# Patient Record
Sex: Female | Born: 2000 | Race: Black or African American | Hispanic: No | Marital: Single | Smoking: Never smoker
Health system: Southern US, Community
[De-identification: ages and names within clinical notes are randomized; demographics above are authoritative.]

## PROBLEM LIST (undated history)

## (undated) DIAGNOSIS — Z789 Other specified health status: Secondary | ICD-10-CM

## (undated) HISTORY — PX: NO PAST SURGERIES: SHX2092

---
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
## 2018-04-21 ENCOUNTER — Encounter (HOSPITAL_COMMUNITY): Payer: Self-pay | Admitting: Emergency Medicine

## 2018-04-21 ENCOUNTER — Other Ambulatory Visit: Payer: Self-pay

## 2018-04-21 ENCOUNTER — Emergency Department (HOSPITAL_COMMUNITY)
Admission: EM | Admit: 2018-04-21 | Discharge: 2018-04-21 | Disposition: A | Discharge status: Home / Self Care | Payer: Medicaid Other | Attending: Emergency Medicine | Admitting: Emergency Medicine

## 2018-04-21 DIAGNOSIS — Z7722 Contact with and (suspected) exposure to environmental tobacco smoke (acute) (chronic): Secondary | ICD-10-CM | POA: Insufficient documentation

## 2018-04-21 DIAGNOSIS — L0291 Cutaneous abscess, unspecified: Secondary | ICD-10-CM

## 2018-04-21 DIAGNOSIS — L02416 Cutaneous abscess of left lower limb: Secondary | ICD-10-CM | POA: Insufficient documentation

## 2018-04-21 MED ORDER — LIDOCAINE HCL (PF) 1 % IJ SOLN
10.0000 mL | Freq: Once | INTRAMUSCULAR | Status: DC
  Filled 2018-04-21: qty 10

## 2018-04-21 MED ORDER — POVIDONE-IODINE 10 % EX SOLN
CUTANEOUS | Status: AC
  Filled 2018-04-21: qty 15

## 2018-04-21 MED ORDER — SULFAMETHOXAZOLE-TRIMETHOPRIM 800-160 MG PO TABS: 1.0000 | ORAL_TABLET | Freq: Two times a day (BID) | ORAL | 0 refills | Status: AC

## 2018-04-21 MED ORDER — LIDOCAINE HCL (PF) 1 % IJ SOLN
INTRAMUSCULAR | Status: AC
  Filled 2018-04-21: qty 5

## 2018-04-21 NOTE — ED Triage Notes (Signed)
Patient with abscess to her upper inner left thigh, onset 1 week ago.

## 2018-04-21 NOTE — Discharge Instructions (Signed)
You need to soak area 20 minutes 4 times a day.  Return if fever or chills.

## 2018-04-21 NOTE — ED Provider Notes (Signed)
[facility] EMERGENCY DEPARTMENT Provider Note   CSN: [id number] Arrival date & time: 04/21/18  1802     History   Chief Complaint Chief Complaint  Patient presents with  . Abscess    HPI Cindy Randall is a 17 y.o. female.  The history is provided by the patient. No language interpreter was used.  Abscess  Location:  Leg Leg abscess location:  L upper leg Size:  2  Abscess quality: painful, redness and warmth   Red streaking: no   Progression:  Worsening Pain details:    Quality:  No pain   Severity:  No pain   Timing:  Constant   Progression:  Worsening Relieved by:  Nothing Worsened by:  Nothing Ineffective treatments:  None tried   History reviewed. No pertinent past medical history.  Patient Active Problem List   Diagnosis Date Noted  . Contraception management 01/21/2013    History reviewed. No pertinent surgical history.   OB History   None      Home Medications    Prior to Admission medications   Medication Sig Start Date End Date Taking? Authorizing Provider  ibuprofen (ADVIL,MOTRIN) 200 MG tablet Take 200 mg by mouth every 6 (six) hours as needed.    [provider]  ondansetron (ZOFRAN ODT) 4 MG disintegrating tablet Take 1 tablet (4 mg total) by mouth every 8 (eight) hours as needed for nausea or vomiting. 10/16/17   Samuel JesterMcManus, Kathleen, DO  sulfamethoxazole-trimethoprim (BACTRIM DS,SEPTRA DS) 800-160 MG tablet Take 1 tablet by mouth 2 (two) times daily for 7 days. 04/21/18 04/28/18  Elson AreasSofia, Leslie K, PA-C    Family History History reviewed. No pertinent family history.  Social History Social History   Tobacco Use  . Smoking status: Passive Smoke Exposure - Never Smoker  . Smokeless tobacco: Never Used  Substance Use Topics  . Alcohol use: No  . Drug use: Yes    Types: Marijuana    Comment: yesterday     Allergies   Patient has no known allergies.   Review of Systems Review of Systems  All other systems reviewed  and are negative.    Physical Exam Updated Vital Signs BP 120/70   Pulse (!) 106   Temp 98.6 F (37 C) (Oral)   Resp 12   Ht 5' (1.524 m)   Wt 65.8 kg   LMP 04/17/2018   SpO2 96%   BMI 28.32 kg/m   Physical Exam  Constitutional: She appears well-developed and well-nourished.  Musculoskeletal: Normal range of motion.  2cm area of swelling left inner thigh.   Neurological: She is alert.  Skin: Skin is warm.  Nursing note and vitals reviewed.    ED Treatments / Results  Labs (all labs ordered are listed, but only abnormal results are displayed) Labs Reviewed - No data to display  EKG None  Radiology No results found.  Procedures Procedures (including critical care time)  Medications Ordered in ED Medications  lidocaine (PF) (XYLOCAINE) 1 % injection 10 mL (has no administration in time range)  lidocaine (PF) (XYLOCAINE) 1 % injection (has no administration in time range)  povidone-iodine (BETADINE) 10 % external solution (has no administration in time range)     Initial Impression / Assessment and Plan / ED Course  I have reviewed the triage vital signs and the nursing notes.  Pertinent labs & imaging results that were available during my care of the patient were reviewed by me and considered in my medical decision making (  see chart for details).     I attempted I and D.  Pt unable to tolerate. (Pt laughing and jerking.  I am unable to numb area. Pt kicking leg)  Pt states she can not do.  I will try treating with antibiotic and warm compresses   Final Clinical Impressions(s) / ED Diagnoses   Final diagnoses:  Abscess    ED Discharge Orders         Ordered    sulfamethoxazole-trimethoprim (BACTRIM DS,SEPTRA DS) 800-160 MG tablet  2 times daily     04/21/18 1916        An After Visit Summary was printed and given to the patient.   Elson Areas, Cordelia Poche 04/21/18 2028    Vanetta Mulders, MD 04/22/18 [phone]

## 2020-08-28 ENCOUNTER — Ambulatory Visit
Admission: EM | Admit: 2020-08-28 | Discharge: 2020-08-28 | Disposition: A | Discharge status: Home / Self Care | Payer: Self-pay

## 2020-08-28 ENCOUNTER — Encounter: Payer: Self-pay | Admitting: Emergency Medicine

## 2020-08-28 ENCOUNTER — Other Ambulatory Visit: Payer: Self-pay

## 2020-08-28 DIAGNOSIS — N764 Abscess of vulva: Secondary | ICD-10-CM

## 2020-08-28 MED ORDER — DOXYCYCLINE HYCLATE 100 MG PO CAPS: 100.0000 mg | ORAL_CAPSULE | Freq: Two times a day (BID) | ORAL | 0 refills | Status: DC

## 2020-08-28 NOTE — ED Triage Notes (Signed)
States she has an abscess on her labia.  History of abscesses on the right side.  This one is on the left.

## 2020-08-28 NOTE — ED Provider Notes (Signed)
[facility] CARE CENTER   [id number] 08/28/20 Arrival Time: 1442   WU:[id number]  SUBJECTIVE:  Cindy Randall is a 20 y.o. female who presents with a possible abscess of her left labia x few days. Denies a precipitating event or trauma.  But does admit to shaving.  Has tried epsom salt baths with relief.  Sore to the touch.  Reports similar symptoms and had incision and drainage.  Denies fever, chills, nausea, vomiting. Complains of redness, and swelling.     ROS: As per HPI.  All other pertinent ROS negative.     History reviewed. No pertinent past medical history. History reviewed. No pertinent surgical history. No Known Allergies No current facility-administered medications on file prior to encounter.   Current Outpatient Medications on File Prior to Encounter  Medication Sig Dispense Refill  . clindamycin (CLEOCIN) 300 MG capsule Take 300 mg by mouth 3 (three) times daily.    Marland Kitchen ibuprofen (ADVIL,MOTRIN) 200 MG tablet Take 200 mg by mouth every 6 (six) hours as needed.     Social History   Socioeconomic History  . Marital status: Single    Spouse name: Not on file  . Number of children: Not on file  . Years of education: Not on file  . Highest education level: Not on file  Occupational History  . Not on file  Tobacco Use  . Smoking status: Passive Smoke Exposure - Never Smoker  . Smokeless tobacco: Never Used  Vaping Use  . Vaping Use: Never used  Substance and Sexual Activity  . Alcohol use: No  . Drug use: Yes    Types: Marijuana    Comment: yesterday  . Sexual activity: Not Currently    Birth control/protection: Implant  Other Topics Concern  . Not on file  Social History Narrative  . Not on file   Social Determinants of Health   Financial Resource Strain: Not on file  Food Insecurity: Not on file  Transportation Needs: Not on file  Physical Activity: Not on file  Stress: Not on file  Social Connections: Not on file  Intimate Partner Violence: Not on  file   History reviewed. No pertinent family history.  OBJECTIVE:  Vitals:   08/28/20 1453  BP: 122/77  Pulse: 92  Resp: 18  Temp: 99 F (37.2 C)  TempSrc: Oral  SpO2: 98%     General appearance: alert; no distress Skin: 2-3 cm induration of her LT labia; tender to touch; no active drainage Psychological: alert and cooperative; normal mood and affect  ASSESSMENT & PLAN:  1. Labial abscess     Meds ordered this encounter  Medications  . doxycycline (VIBRAMYCIN) 100 MG capsule    Sig: Take 1 capsule (100 mg total) by mouth 2 (two) times daily.    Dispense:  20 capsule    Refill:  0    Order Specific Question:   Supervising Provider    Answer:   Eustace Moore [[id number]]    Apply warm compresses 3-4x daily for 10-15 minutes Wash site daily with warm water and mild soap Keep covered to avoid friction Take antibiotic as prescribed and to completion Follow up here or with PCP if symptoms persists Return or go to the ED if you have any new or worsening symptoms increased redness, swelling, pain, nausea, vomiting, fever, chills, etc...   Reviewed expectations re: course of current medical issues. Questions answered. Outlined signs and symptoms indicating need for more acute intervention. Patient verbalized understanding. After Visit Summary given.  Rennis Harding, PA-C 08/28/20 1514

## 2020-08-28 NOTE — Discharge Instructions (Signed)
Apply warm compresses 3-4x daily for 10-15 minutes Wash site daily with warm water and mild soap Keep covered to avoid friction Take antibiotic as prescribed and to completion Follow up here or with PCP if symptoms persists Return or go to the ED if you have any new or worsening symptoms increased redness, swelling, pain, nausea, vomiting, fever, chills, etc...  

## 2021-01-29 ENCOUNTER — Emergency Department (HOSPITAL_COMMUNITY): Payer: No Typology Code available for payment source

## 2021-01-29 ENCOUNTER — Other Ambulatory Visit: Payer: Self-pay

## 2021-01-29 ENCOUNTER — Encounter (HOSPITAL_COMMUNITY): Payer: Self-pay

## 2021-01-29 ENCOUNTER — Emergency Department (HOSPITAL_COMMUNITY)
Admission: EM | Admit: 2021-01-29 | Discharge: 2021-01-29 | Disposition: A | Discharge status: Home / Self Care | Payer: No Typology Code available for payment source | Attending: Emergency Medicine | Admitting: Emergency Medicine

## 2021-01-29 DIAGNOSIS — Y900 Blood alcohol level of less than 20 mg/100 ml: Secondary | ICD-10-CM | POA: Insufficient documentation

## 2021-01-29 DIAGNOSIS — S0081XA Abrasion of other part of head, initial encounter: Secondary | ICD-10-CM | POA: Diagnosis not present

## 2021-01-29 DIAGNOSIS — S99912A Unspecified injury of left ankle, initial encounter: Secondary | ICD-10-CM | POA: Diagnosis present

## 2021-01-29 DIAGNOSIS — S82842A Displaced bimalleolar fracture of left lower leg, initial encounter for closed fracture: Secondary | ICD-10-CM | POA: Insufficient documentation

## 2021-01-29 DIAGNOSIS — J3489 Other specified disorders of nose and nasal sinuses: Secondary | ICD-10-CM | POA: Insufficient documentation

## 2021-01-29 DIAGNOSIS — Z20822 Contact with and (suspected) exposure to covid-19: Secondary | ICD-10-CM | POA: Diagnosis not present

## 2021-01-29 DIAGNOSIS — T1490XA Injury, unspecified, initial encounter: Secondary | ICD-10-CM

## 2021-01-29 DIAGNOSIS — Z79899 Other long term (current) drug therapy: Secondary | ICD-10-CM | POA: Diagnosis not present

## 2021-01-29 DIAGNOSIS — Z7722 Contact with and (suspected) exposure to environmental tobacco smoke (acute) (chronic): Secondary | ICD-10-CM | POA: Diagnosis not present

## 2021-01-29 DIAGNOSIS — E872 Acidosis: Secondary | ICD-10-CM | POA: Diagnosis not present

## 2021-01-29 DIAGNOSIS — R000 Tachycardia, unspecified: Secondary | ICD-10-CM | POA: Diagnosis not present

## 2021-01-29 DIAGNOSIS — E876 Hypokalemia: Secondary | ICD-10-CM | POA: Diagnosis not present

## 2021-01-29 DIAGNOSIS — Y92410 Unspecified street and highway as the place of occurrence of the external cause: Secondary | ICD-10-CM | POA: Diagnosis not present

## 2021-01-29 LAB — HCG, QUANTITATIVE, PREGNANCY: hCG, Beta Chain, Quant, S: <1 m[IU]/mL (ref <5)

## 2021-01-29 LAB — COMPREHENSIVE METABOLIC PANEL
ALT: 31 U/L (ref 0–44)
AST: 61 U/L — ABNORMAL HIGH (ref 15–41)
Albumin: 3.7 g/dL (ref 3.5–5.0)
Alkaline Phosphatase: 38 U/L (ref 38–126)
Anion gap: 7 (ref 5–15)
BUN: 10 mg/dL (ref 6–20)
CO2: 22 mmol/L (ref 22–32)
Calcium: 8.4 mg/dL — ABNORMAL LOW (ref 8.9–10.3)
Chloride: 109 mmol/L (ref 98–111)
Creatinine, Ser: 0.91 mg/dL (ref 0.44–1.00)
GFR, Estimated: >60 mL/min (ref >60)
Glucose, Bld: 133 mg/dL — ABNORMAL HIGH (ref 70–99)
Potassium: 3.2 mmol/L — ABNORMAL LOW (ref 3.5–5.1)
Sodium: 138 mmol/L (ref 135–145)
Total Bilirubin: 0.4 mg/dL (ref 0.3–1.2)
Total Protein: 6.5 g/dL (ref 6.5–8.1)

## 2021-01-29 LAB — RESP PANEL BY RT-PCR (FLU A&B, COVID) ARPGX2
Influenza A by PCR: NEGATIVE
Influenza B by PCR: NEGATIVE
SARS Coronavirus 2 by RT PCR: NEGATIVE

## 2021-01-29 LAB — CBC WITH DIFFERENTIAL/PLATELET
Abs Immature Granulocytes: 0.04 10*3/uL (ref 0.00–0.07)
Basophils Absolute: 0.1 10*3/uL (ref 0.0–0.1)
Basophils Relative: 0 %
Eosinophils Absolute: 0.1 10*3/uL (ref 0.0–0.5)
Eosinophils Relative: 1 %
HCT: 37.5 % (ref 36.0–46.0)
Hemoglobin: 12.6 g/dL (ref 12.0–15.0)
Immature Granulocytes: 0 %
Lymphocytes Relative: 18 %
Lymphs Abs: 2 10*3/uL (ref 0.7–4.0)
MCH: 31.3 pg (ref 26.0–34.0)
MCHC: 33.6 g/dL (ref 30.0–36.0)
MCV: 93.3 fL (ref 80.0–100.0)
Monocytes Absolute: 0.7 10*3/uL (ref 0.1–1.0)
Monocytes Relative: 7 %
Neutro Abs: 8.5 10*3/uL — ABNORMAL HIGH (ref 1.7–7.7)
Neutrophils Relative %: 74 %
Platelets: 181 10*3/uL (ref 150–400)
RBC: 4.02 MIL/uL (ref 3.87–5.11)
RDW: 13.4 % (ref 11.5–15.5)
WBC: 11.4 10*3/uL — ABNORMAL HIGH (ref 4.0–10.5)
nRBC: 0 % (ref 0.0–0.2)

## 2021-01-29 LAB — ETHANOL: Alcohol, Ethyl (B): <10 mg/dL (ref <10)

## 2021-01-29 LAB — PROTIME-INR
INR: 1.1 (ref 0.8–1.2)
Prothrombin Time: 13.8 seconds (ref 11.4–15.2)

## 2021-01-29 LAB — LACTIC ACID, PLASMA: Lactic Acid, Venous: 2.2 mmol/L (ref 0.5–1.9)

## 2021-01-29 MED ORDER — FENTANYL CITRATE PF 50 MCG/ML IJ SOSY
100.0000 ug | PREFILLED_SYRINGE | Freq: Once | INTRAMUSCULAR | Status: AC
  Administered 2021-01-29: 50 ug via INTRAVENOUS
  Filled 2021-01-29: qty 2

## 2021-01-29 MED ORDER — LACTATED RINGERS IV BOLUS
1000.0000 mL | Freq: Once | INTRAVENOUS | Status: AC
  Administered 2021-01-29: 1000 mL via INTRAVENOUS

## 2021-01-29 MED ORDER — OXYCODONE-ACETAMINOPHEN 5-325 MG PO TABS: 1.0000 | ORAL_TABLET | Freq: Four times a day (QID) | ORAL | 0 refills | Status: DC | PRN

## 2021-01-29 MED ORDER — ONDANSETRON HCL 4 MG/2ML IJ SOLN
INTRAMUSCULAR | Status: AC
  Administered 2021-01-29: 4 mg via INTRAVENOUS
  Filled 2021-01-29: qty 2

## 2021-01-29 MED ORDER — ONDANSETRON HCL 4 MG/2ML IJ SOLN
4.0000 mg | Freq: Once | INTRAMUSCULAR | Status: AC
Start: 2021-01-29 — End: 2021-01-29
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
## 2021-01-29 NOTE — ED Notes (Signed)
Pt verbalized understanding of no driving and to use caution within 4 hours of taking pain meds due to meds cause drowsiness.  Pt made aware that pain medication causes constipation as well.
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
## 2021-01-29 NOTE — ED Notes (Signed)
Patient transported to CT 

## 2021-01-29 NOTE — ED Notes (Signed)
Date and time results received: 01/29/21 1210   Test: lac Critical Value: 2.2  Name of Provider Notified: Lupe Carney

## 2021-01-30 ENCOUNTER — Ambulatory Visit (INDEPENDENT_AMBULATORY_CARE_PROVIDER_SITE_OTHER): Payer: Self-pay | Admitting: Orthopedic Surgery

## 2021-01-30 ENCOUNTER — Encounter: Payer: Self-pay | Admitting: Orthopedic Surgery

## 2021-01-30 VITALS — BP 113/72 | HR 80 | Ht 62.0 in | Wt 124.0 lb

## 2021-01-30 DIAGNOSIS — S82892A Other fracture of left lower leg, initial encounter for closed fracture: Secondary | ICD-10-CM
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
## 2021-01-30 NOTE — Addendum Note (Signed)
Addended by: Thane Edu A on: 01/30/2021 10:39 PM   Modules accepted: Orders, SmartSet

## 2021-01-31 NOTE — Patient Instructions (Signed)
Cindy Randall  01/31/2021     @PREFPERIOPPHARMACY @   Your procedure is scheduled on  02/07/2021.   Report to 04/09/2021 at  [phone]  A.M.   Call this number if you have problems the morning of surgery:  [phone]   Remember:  Do not eat or drink after midnight.      Take these medicines the morning of surgery with A SIP OF WATER                                oxycodone (if needed).      Do not wear jewelry, make-up or nail polish.  Do not wear lotions, powders, or perfumes, or deodorant.  Do not shave 48 hours prior to surgery.  Men may shave face and neck.  Do not bring valuables to the hospital.  [facility] is not responsible for any belongings or valuables.  Contacts, dentures or bridgework may not be worn into surgery.  Leave your suitcase in the car.  After surgery it may be brought to your room.  For patients admitted to the hospital, discharge time will be determined by your treatment team.  Patients discharged the day of surgery will not be allowed to drive home and must have someone with them for 24 hours.    Special instructions:    DO NOT smoke tobacco or vape for 24 hours before your procedure.  Please read over the following fact sheets that you were given. Coughing and Deep Breathing, Surgical Site Infection Prevention, Anesthesia Post-op Instructions, and Care and Recovery After Surgery      Displaced Bimalleolar Ankle Fracture Treated With ORIF, Care After This sheet gives you information about how to care for yourself after your procedure. Your health care provider may also give you more specific instructions. If you have problems or questions, contact your health care provider. What can I expect after the procedure? After the procedure, it is common to have: Pain. Swelling. A small amount of fluid from your incision. Follow these instructions at home: Medicines Take over-the-counter and prescription medicines only as told by your  health care provider. Ask your health care provider if the medicine prescribed to you: Requires you to avoid driving or using machinery. Can cause constipation. You may need to take these actions to prevent or treat constipation: Drink enough fluid to keep your urine pale yellow. Take over-the-counter or prescription medicines. Eat foods that are high in fiber, such as beans, whole grains, and fresh fruits and vegetables. Limit foods that are high in fat and processed sugars, such as fried or sweet foods. If you have a splint or boot: Wear the splint or boot as told by your health care provider. Remove it only as told by your health care provider. Loosen the splint or boot if your toes tingle, become numb, or turn cold and blue. Keep the splint or boot clean. If you have a cast: Do not stick anything inside the cast to scratch your skin. Doing that increases your risk of infection. Check the skin around the cast every day. Tell your health care provider about any concerns. You may put lotion on dry skin around the edges of the cast. Do not put lotion on the skin underneath the cast. Keep the cast clean. Bathing Do not take baths, swim, or use a hot tub until your health care provider approves. Ask your  health care provider if you may take showers. You may only be allowed to take sponge baths. If your splint, boot, or cast is not waterproof: Do not let it get wet. Cover it with a watertight covering when you take a bath or a shower. Keep the bandage (dressing) dry until your health care provider says it can be removed. Incision care  Follow instructions from your health care provider about how to take care of your incision. Make sure you: Wash your hands with soap and water for at least 20 seconds before and after you change your dressing. If soap and water are not available, use hand sanitizer. Change your dressing as told by your health care provider. Leave stitches (sutures), skin glue,  or adhesive strips in place. These skin closures may need to stay in place for 2 weeks or longer. If adhesive strip edges start to loosen and curl up, you may trim the loose edges. Do not remove adhesive strips completely unless your health care provider tells you to do that. Check your incision area every day for signs of infection. Check for: Redness. More pain or more swelling. Blood or more fluid. Warmth. Pus or a bad smell. Managing pain, stiffness, and swelling  If directed, put ice on the affected area. To do this: If you have a removable splint or boot, remove it as told by your health care provider. Put ice in a plastic bag. Place a towel between your skin and the bag, or between your cast and the bag. Leave the ice on for 20 minutes, 2-3 times a day. Remove the ice if your skin turns bright red. This is very important. If you cannot feel pain, heat, or cold, you have a greater risk of damage to the area. Move your toes often to reduce stiffness and swelling. Raise (elevate) the injured area above the level of your heart while you are sitting or lying down. To do this, try putting a few pillows under your leg and ankle. Activity Do not use your injured limb to support (bear) your body weight until your health care provider says that you can. Follow weight-bearing restrictions as told. Use crutches or other assistive devicesto help you move around as told by your health care provider. Ask your health care provider when it is safe to drive if you have a splint, boot, or cast on your foot. Do exercises as told by your health care provider or physical therapist. Return to your normal activities as told by your health care provider. Ask your health care provider what activities are safe for you. General instructions Do not put pressure on any part of the splint or cast until it is fully hardened, if applicable. This may take several hours. Do not use any products that contain nicotine or  tobacco, such as cigarettes, e-cigarettes, and chewing tobacco. These can delay bone healing. If you need help quitting, ask your health care provider. Keep all follow-up visits. This is important. Contact a health care provider if: You have a fever. Your pain medicine is not helping. You have redness around your incision. You have more swelling or severe pain around your incision. You have more fluid or blood coming from your incision or leaking through your cast. Your incision feels warm to the touch. You have pus or a bad smell coming from your incision or from your cast or dressing. Get help right away if: The edges of your incision come apart after the stitches or staples have  been taken out. You have chest pain. You have trouble breathing. Your foot or leg feels numb or tingles. Your foot becomes cold, pale, or blue. You have calf swelling or tenderness. These symptoms may represent a serious problem that is an emergency. Do not wait to see if the symptoms will go away. Get medical help right away. Call your local emergency services (911 in the [place] Do not drive yourself to the hospital. Summary After the procedure, it is common to have pain and swelling. If your splint, boot, or cast is not waterproof, do not let it get wet. Contact your health care provider if you have more swelling or severe pain, or if you have more fluids coming from your incision or leaking through your cast. Get help right away if you have numbness or tingling in your foot or leg, or if your foot becomes cold, pale, or blue. This information is not intended to replace advice given to you by your health care provider. Make sure you discuss any questions you have with your health care provider. Document Revised: 08/22/2019 Document Reviewed: 08/22/2019 Elsevier Patient Education  2022 [employer]. General Anesthesia, Adult, Care After This sheet gives you information about how to care for yourself after your  procedure. Your health care provider may also give you more specific instructions. If you have problems or questions, contact your health care provider. What can I expect after the procedure? After the procedure, the following side effects are common: Pain or discomfort at the IV site. Nausea. Vomiting. Sore throat. Trouble concentrating. Feeling cold or chills. Feeling weak or tired. Sleepiness and fatigue. Soreness and body aches. These side effects can affect parts of the body that were not involved in surgery. Follow these instructions at home: For the time period you were told by your health care provider:  Rest. Do not participate in activities where you could fall or become injured. Do not drive or use machinery. Do not drink alcohol. Do not take sleeping pills or medicines that cause drowsiness. Do not make important decisions or sign legal documents. Do not take care of children on your own. Eating and drinking Follow any instructions from your health care provider about eating or drinking restrictions. When you feel hungry, start by eating small amounts of foods that are soft and easy to digest (bland), such as toast. Gradually return to your regular diet. Drink enough fluid to keep your urine pale yellow. If you vomit, rehydrate by drinking water, juice, or clear broth. General instructions If you have sleep apnea, surgery and certain medicines can increase your risk for breathing problems. Follow instructions from your health care provider about wearing your sleep device: Anytime you are sleeping, including during daytime naps. While taking prescription pain medicines, sleeping medicines, or medicines that make you drowsy. Have a responsible adult stay with you for the time you are told. It is important to have someone help care for you until you are awake and alert. Return to your normal activities as told by your health care provider. Ask your health care provider what  activities are safe for you. Take over-the-counter and prescription medicines only as told by your health care provider. If you smoke, do not smoke without supervision. Keep all follow-up visits as told by your health care provider. This is important. Contact a health care provider if: You have nausea or vomiting that does not get better with medicine. You cannot eat or drink without vomiting. You have pain that does not  get better with medicine. You are unable to pass urine. You develop a skin rash. You have a fever. You have redness around your IV site that gets worse. Get help right away if: You have difficulty breathing. You have chest pain. You have blood in your urine or stool, or you vomit blood. Summary After the procedure, it is common to have a sore throat or nausea. It is also common to feel tired. Have a responsible adult stay with you for the time you are told. It is important to have someone help care for you until you are awake and alert. When you feel hungry, start by eating small amounts of foods that are soft and easy to digest (bland), such as toast. Gradually return to your regular diet. Drink enough fluid to keep your urine pale yellow. Return to your normal activities as told by your health care provider. Ask your health care provider what activities are safe for you. This information is not intended to replace advice given to you by your health care provider. Make sure you discuss any questions you have with your health care provider. Document Revised: 02/02/2020 Document Reviewed: 09/01/2019 Elsevier Patient Education  2022 Else[employer] Inc. How to Use Chlorhexidine for Bathing Chlorhexidine gluconate (CHG) is a germ-killing (antiseptic) solution that is used to clean the skin. It can get rid of the bacteria that normally live on the skin and can keep them away for about 24 hours. To clean your skin with CHG, you may be given: A CHG solution to use in the shower or as  part of a sponge bath. A prepackaged cloth that contains CHG. Cleaning your skin with CHG may help lower the risk for infection: While you are staying in the intensive care unit of the hospital. If you have a vascular access, such as a central line, to provide short-term or long-term access to your veins. If you have a catheter to drain urine from your bladder. If you are on a ventilator. A ventilator is a machine that helps you breathe by moving air in and out of your lungs. After surgery. What are the risks? Risks of using CHG include: A skin reaction. Hearing loss, if CHG gets in your ears and you have a perforated eardrum. Eye injury, if CHG gets in your eyes and is not rinsed out. The CHG product catching fire. Make sure that you avoid smoking and flames after applying CHG to your skin. Do not use CHG: If you have a chlorhexidine allergy or have previously reacted to chlorhexidine. On babies younger than 49 months of age. How to use CHG solution Use CHG only as told by your health care provider, and follow the instructions on the label. Use the full amount of CHG as directed. Usually, this is one bottle. During a shower Follow these steps when using CHG solution during a shower (unless your health care provider gives you different instructions): Start the shower. Use your normal soap and shampoo to wash your face and hair. Turn off the shower or move out of the shower stream. Pour the CHG onto a clean washcloth. Do not use any type of brush or rough-edged sponge. Starting at your neck, lather your body down to your toes. Make sure you follow these instructions: If you will be having surgery, pay special attention to the part of your body where you will be having surgery. Scrub this area for at least 1 minute. Do not use CHG on your head or face. If  the solution gets into your ears or eyes, rinse them well with water. Avoid your genital area. Avoid any areas of skin that have broken  skin, cuts, or scrapes. Scrub your back and under your arms. Make sure to wash skin folds. Let the lather sit on your skin for 1-2 minutes or as long as told by your health care provider. Thoroughly rinse your entire body in the shower. Make sure that all body creases and crevices are rinsed well. Dry off with a clean towel. Do not put any substances on your body afterward--such as powder, lotion, or perfume--unless you are told to do so by your health care provider. Only use lotions that are recommended by the manufacturer. Put on clean clothes or pajamas. If it is the night before your surgery, sleep in clean sheets.  During a sponge bath Follow these steps when using CHG solution during a sponge bath (unless your health care provider gives you different instructions): Use your normal soap and shampoo to wash your face and hair. Pour the CHG onto a clean washcloth. Starting at your neck, lather your body down to your toes. Make sure you follow these instructions: If you will be having surgery, pay special attention to the part of your body where you will be having surgery. Scrub this area for at least 1 minute. Do not use CHG on your head or face. If the solution gets into your ears or eyes, rinse them well with water. Avoid your genital area. Avoid any areas of skin that have broken skin, cuts, or scrapes. Scrub your back and under your arms. Make sure to wash skin folds. Let the lather sit on your skin for 1-2 minutes or as long as told by your health care provider. Using a different clean, wet washcloth, thoroughly rinse your entire body. Make sure that all body creases and crevices are rinsed well. Dry off with a clean towel. Do not put any substances on your body afterward--such as powder, lotion, or perfume--unless you are told to do so by your health care provider. Only use lotions that are recommended by the manufacturer. Put on clean clothes or pajamas. If it is the night before your  surgery, sleep in clean sheets. How to use CHG prepackaged cloths Only use CHG cloths as told by your health care provider, and follow the instructions on the label. Use the CHG cloth on clean, dry skin. Do not use the CHG cloth on your head or face unless your health care provider tells you to. When washing with the CHG cloth: Avoid your genital area. Avoid any areas of skin that have broken skin, cuts, or scrapes. Before surgery Follow these steps when using a CHG cloth to clean before surgery (unless your health care provider gives you different instructions): Using the CHG cloth, vigorously scrub the part of your body where you will be having surgery. Scrub using a back-and-forth motion for 3 minutes. The area on your body should be completely wet with CHG when you are done scrubbing. Do not rinse. Discard the cloth and let the area air-dry. Do not put any substances on the area afterward, such as powder, lotion, or perfume. Put on clean clothes or pajamas. If it is the night before your surgery, sleep in clean sheets.  For general bathing Follow these steps when using CHG cloths for general bathing (unless your health care provider gives you different instructions). Use a separate CHG cloth for each area of your body. Make sure  you wash between any folds of skin and between your fingers and toes. Wash your body in the following order, switching to a new cloth after each step: The front of your neck, shoulders, and chest. Both of your arms, under your arms, and your hands. Your stomach and groin area, avoiding the genitals. Your right leg and foot. Your left leg and foot. The back of your neck, your back, and your buttocks. Do not rinse. Discard the cloth and let the area air-dry. Do not put any substances on your body afterward--such as powder, lotion, or perfume--unless you are told to do so by your health care provider. Only use lotions that are recommended by the manufacturer. Put on  clean clothes or pajamas. Contact a health care provider if: Your skin gets irritated after scrubbing. You have questions about using your solution or cloth. You swallow any chlorhexidine. Call your local poison control center ([phone] in the [place] Get help right away if: Your eyes itch badly, or they become very red or swollen. Your skin itches badly and is red or swollen. Your hearing changes. You have trouble seeing. You have swelling or tingling in your mouth or throat. You have trouble breathing. These symptoms may represent a serious problem that is an emergency. Do not wait to see if the symptoms will go away. Get medical help right away. Call your local emergency services (911 in the [place] Do not drive yourself to the hospital. Summary Chlorhexidine gluconate (CHG) is a germ-killing (antiseptic) solution that is used to clean the skin. Cleaning your skin with CHG may help to lower your risk for infection. You may be given CHG to use for bathing. It may be in a bottle or in a prepackaged cloth to use on your skin. Carefully follow your health care provider's instructions and the instructions on the product label. Do not use CHG if you have a chlorhexidine allergy. Contact your health care provider if your skin gets irritated after scrubbing. This information is not intended to replace advice given to you by your health care provider. Make sure you discuss any questions you have with your health care provider. Document Revised: 07/30/2020 Document Reviewed: 07/30/2020 Elsevier Patient Education  2022 [employer].

## 2021-02-03 ENCOUNTER — Emergency Department (HOSPITAL_COMMUNITY): Payer: No Typology Code available for payment source

## 2021-02-03 ENCOUNTER — Other Ambulatory Visit: Payer: Self-pay

## 2021-02-03 ENCOUNTER — Encounter (HOSPITAL_COMMUNITY): Payer: Self-pay

## 2021-02-03 ENCOUNTER — Emergency Department (HOSPITAL_COMMUNITY)
Admission: EM | Admit: 2021-02-03 | Discharge: 2021-02-03 | Disposition: A | Discharge status: Home / Self Care | Payer: No Typology Code available for payment source | Attending: Emergency Medicine | Admitting: Emergency Medicine

## 2021-02-03 DIAGNOSIS — M25572 Pain in left ankle and joints of left foot: Secondary | ICD-10-CM | POA: Diagnosis present

## 2021-02-03 DIAGNOSIS — Y92410 Unspecified street and highway as the place of occurrence of the external cause: Secondary | ICD-10-CM | POA: Diagnosis not present

## 2021-02-03 NOTE — Discharge Instructions (Addendum)
Please follow up with your orthopedic doctor next week.   Continue to keep your leg elevated when at rest   Please return to the emergency department for any new or worsening symptoms.
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
## 2021-02-03 NOTE — ED Triage Notes (Signed)
Pt. States that they believe their left foot needs to be re-splinted. Per pt. Their foot has moved further out and their left leg hurts. Per Pt. They did see the orthopedic doctor last week.

## 2021-02-05 ENCOUNTER — Other Ambulatory Visit: Payer: Self-pay

## 2021-02-05 ENCOUNTER — Encounter (HOSPITAL_COMMUNITY)
Admission: RE | Admit: 2021-02-05 | Discharge: 2021-02-05 | Disposition: A | Discharge status: Home / Self Care | Payer: Self-pay | Source: Ambulatory Visit | Attending: Orthopedic Surgery | Admitting: Orthopedic Surgery

## 2021-02-05 DIAGNOSIS — Z01812 Encounter for preprocedural laboratory examination: Secondary | ICD-10-CM | POA: Insufficient documentation

## 2021-02-05 LAB — BASIC METABOLIC PANEL
Anion gap: 7 (ref 5–15)
BUN: 8 mg/dL (ref 6–20)
CO2: 26 mmol/L (ref 22–32)
Calcium: 8.9 mg/dL (ref 8.9–10.3)
Chloride: 107 mmol/L (ref 98–111)
Creatinine, Ser: 0.83 mg/dL (ref 0.44–1.00)
GFR, Estimated: >60 mL/min (ref >60)
Glucose, Bld: 99 mg/dL (ref 70–99)
Potassium: 4 mmol/L (ref 3.5–5.1)
Sodium: 140 mmol/L (ref 135–145)

## 2021-02-05 LAB — CBC
HCT: 33.6 % — ABNORMAL LOW (ref 36.0–46.0)
Hemoglobin: 11.1 g/dL — ABNORMAL LOW (ref 12.0–15.0)
MCH: 30.7 pg (ref 26.0–34.0)
MCHC: 33 g/dL (ref 30.0–36.0)
MCV: 92.8 fL (ref 80.0–100.0)
Platelets: 217 10*3/uL (ref 150–400)
RBC: 3.62 MIL/uL — ABNORMAL LOW (ref 3.87–5.11)
RDW: 13.4 % (ref 11.5–15.5)
WBC: 3.8 10*3/uL — ABNORMAL LOW (ref 4.0–10.5)
nRBC: 0 % (ref 0.0–0.2)

## 2021-02-05 LAB — RAPID URINE DRUG SCREEN, HOSP PERFORMED
Amphetamines: NOT DETECTED
Barbiturates: NOT DETECTED
Benzodiazepines: NOT DETECTED
Cocaine: NOT DETECTED
Opiates: NOT DETECTED
Tetrahydrocannabinol: POSITIVE — AB

## 2021-02-05 LAB — HCG, SERUM, QUALITATIVE: Preg, Serum: NEGATIVE

## 2021-02-07 ENCOUNTER — Encounter (HOSPITAL_COMMUNITY): Payer: Self-pay | Admitting: Orthopedic Surgery

## 2021-02-07 ENCOUNTER — Ambulatory Visit (HOSPITAL_COMMUNITY): Payer: No Typology Code available for payment source

## 2021-02-07 ENCOUNTER — Encounter (HOSPITAL_COMMUNITY)
Admission: RE | Disposition: A | Discharge status: Home / Self Care | Payer: Self-pay | Source: Home / Self Care | Attending: Orthopedic Surgery

## 2021-02-07 ENCOUNTER — Ambulatory Visit (HOSPITAL_COMMUNITY)
Admission: RE | Admit: 2021-02-07 | Discharge: 2021-02-07 | Disposition: A | Discharge status: Home / Self Care | Payer: No Typology Code available for payment source | Attending: Orthopedic Surgery | Admitting: Orthopedic Surgery

## 2021-02-07 ENCOUNTER — Ambulatory Visit (HOSPITAL_COMMUNITY): Payer: No Typology Code available for payment source | Admitting: Certified Registered"

## 2021-02-07 DIAGNOSIS — S82842A Displaced bimalleolar fracture of left lower leg, initial encounter for closed fracture: Secondary | ICD-10-CM | POA: Diagnosis present

## 2021-02-07 DIAGNOSIS — Z419 Encounter for procedure for purposes other than remedying health state, unspecified: Secondary | ICD-10-CM

## 2021-02-07 DIAGNOSIS — S82892A Other fracture of left lower leg, initial encounter for closed fracture: Secondary | ICD-10-CM

## 2021-02-07 HISTORY — PX: ORIF ANKLE FRACTURE: SHX5408

## 2021-02-07 SURGERY — OPEN REDUCTION INTERNAL FIXATION (ORIF) ANKLE FRACTURE
Anesthesia: General | Site: Ankle | Laterality: Left

## 2021-02-07 MED ORDER — CHLORHEXIDINE GLUCONATE 0.12 % MT SOLN
15.0000 mL | Freq: Once | OROMUCOSAL | Status: AC
  Administered 2021-02-07: 15 mL via OROMUCOSAL

## 2021-02-07 MED ORDER — ROPIVACAINE HCL 5 MG/ML IJ SOLN
INTRAMUSCULAR | Status: DC | PRN
  Administered 2021-02-07: 20 mL via EPIDURAL
  Administered 2021-02-07: 10 mL via EPIDURAL

## 2021-02-07 MED ORDER — DEXAMETHASONE SODIUM PHOSPHATE 10 MG/ML IJ SOLN
INTRAMUSCULAR | Status: AC
  Filled 2021-02-07: qty 1

## 2021-02-07 MED ORDER — ONDANSETRON HCL 4 MG PO TABS: 4.0000 mg | ORAL_TABLET | Freq: Three times a day (TID) | ORAL | 0 refills | Status: AC | PRN

## 2021-02-07 MED ORDER — FENTANYL CITRATE (PF) 100 MCG/2ML IJ SOLN
INTRAMUSCULAR | Status: AC
  Filled 2021-02-07: qty 2

## 2021-02-07 MED ORDER — ASPIRIN EC 81 MG PO TBEC: 81.0000 mg | DELAYED_RELEASE_TABLET | Freq: Two times a day (BID) | ORAL | 0 refills | Status: DC

## 2021-02-07 MED ORDER — FENTANYL CITRATE (PF) 100 MCG/2ML IJ SOLN
INTRAMUSCULAR | Status: DC | PRN
  Administered 2021-02-07 (×4): 25 ug via INTRAVENOUS

## 2021-02-07 MED ORDER — ROPIVACAINE HCL 5 MG/ML IJ SOLN
INTRAMUSCULAR | Status: AC
  Filled 2021-02-07: qty 30

## 2021-02-07 MED ORDER — METOCLOPRAMIDE HCL 5 MG/ML IJ SOLN
INTRAMUSCULAR | Status: DC | PRN
  Administered 2021-02-07: 10 mg via INTRAVENOUS

## 2021-02-07 MED ORDER — METOCLOPRAMIDE HCL 5 MG/ML IJ SOLN
INTRAMUSCULAR | Status: AC
  Filled 2021-02-07: qty 2

## 2021-02-07 MED ORDER — ONDANSETRON HCL 4 MG/2ML IJ SOLN: 4.0000 mg | Freq: Once | INTRAMUSCULAR | Status: DC | PRN

## 2021-02-07 MED ORDER — ORAL CARE MOUTH RINSE: 15.0000 mL | Freq: Once | OROMUCOSAL | Status: AC

## 2021-02-07 MED ORDER — DEXAMETHASONE SODIUM PHOSPHATE 4 MG/ML IJ SOLN
INTRAMUSCULAR | Status: DC | PRN
  Administered 2021-02-07: 10 mg via INTRAVENOUS

## 2021-02-07 MED ORDER — ONDANSETRON HCL 4 MG/2ML IJ SOLN
INTRAMUSCULAR | Status: AC
  Filled 2021-02-07: qty 2

## 2021-02-07 MED ORDER — LIDOCAINE HCL (PF) 1 % IJ SOLN
INTRAMUSCULAR | Status: AC
  Filled 2021-02-07: qty 30

## 2021-02-07 MED ORDER — ACETAMINOPHEN 500 MG PO TABS: 1000.0000 mg | ORAL_TABLET | Freq: Three times a day (TID) | ORAL | 0 refills | Status: AC

## 2021-02-07 MED ORDER — ONDANSETRON HCL 4 MG/2ML IJ SOLN
INTRAMUSCULAR | Status: DC | PRN
  Administered 2021-02-07: 4 mg via INTRAVENOUS

## 2021-02-07 MED ORDER — DEXMEDETOMIDINE (PRECEDEX) IN NS 20 MCG/5ML (4 MCG/ML) IV SYRINGE
PREFILLED_SYRINGE | INTRAVENOUS | Status: AC
  Filled 2021-02-07: qty 5

## 2021-02-07 MED ORDER — FENTANYL CITRATE PF 50 MCG/ML IJ SOSY: 25.0000 ug | PREFILLED_SYRINGE | INTRAMUSCULAR | Status: DC | PRN

## 2021-02-07 MED ORDER — MIDAZOLAM HCL 2 MG/2ML IJ SOLN
INTRAMUSCULAR | Status: AC
  Filled 2021-02-07: qty 2

## 2021-02-07 MED ORDER — CEFAZOLIN SODIUM-DEXTROSE 2-4 GM/100ML-% IV SOLN
2.0000 g | INTRAVENOUS | Status: AC
  Administered 2021-02-07: 2 g via INTRAVENOUS
  Filled 2021-02-07: qty 100

## 2021-02-07 MED ORDER — PROPOFOL 10 MG/ML IV BOLUS
INTRAVENOUS | Status: DC | PRN
  Administered 2021-02-07: 200 mg via INTRAVENOUS

## 2021-02-07 MED ORDER — LACTATED RINGERS IV SOLN: INTRAVENOUS | Status: DC

## 2021-02-07 MED ORDER — EPHEDRINE 5 MG/ML INJ
INTRAVENOUS | Status: AC
  Filled 2021-02-07: qty 5

## 2021-02-07 MED ORDER — LIDOCAINE HCL (CARDIAC) PF 100 MG/5ML IV SOSY
PREFILLED_SYRINGE | INTRAVENOUS | Status: DC | PRN
  Administered 2021-02-07 (×2): 1 mL via INTRAVENOUS

## 2021-02-07 MED ORDER — EPHEDRINE SULFATE 50 MG/ML IJ SOLN
INTRAMUSCULAR | Status: DC | PRN
  Administered 2021-02-07 (×2): 5 mg via INTRAVENOUS

## 2021-02-07 MED ORDER — BUPIVACAINE-EPINEPHRINE (PF) 0.5% -1:200000 IJ SOLN
INTRAMUSCULAR | Status: DC | PRN
  Administered 2021-02-07: 10 mL

## 2021-02-07 MED ORDER — BUPIVACAINE-EPINEPHRINE (PF) 0.5% -1:200000 IJ SOLN
INTRAMUSCULAR | Status: AC
  Filled 2021-02-07: qty 30

## 2021-02-07 MED ORDER — 0.9 % SODIUM CHLORIDE (POUR BTL) OPTIME
TOPICAL | Status: DC | PRN
  Administered 2021-02-07: 1000 mL

## 2021-02-07 MED ORDER — MIDAZOLAM HCL 5 MG/5ML IJ SOLN
INTRAMUSCULAR | Status: DC | PRN
  Administered 2021-02-07: 2 mg via INTRAVENOUS

## 2021-02-07 MED ORDER — CELECOXIB 100 MG PO CAPS: 100.0000 mg | ORAL_CAPSULE | Freq: Every day | ORAL | 0 refills | Status: AC

## 2021-02-07 MED ORDER — DEXMEDETOMIDINE (PRECEDEX) IN NS 20 MCG/5ML (4 MCG/ML) IV SYRINGE
PREFILLED_SYRINGE | INTRAVENOUS | Status: DC | PRN
  Administered 2021-02-07: 20 ug via INTRAVENOUS

## 2021-02-07 MED ORDER — OXYCODONE HCL 5 MG PO TABS: 5.0000 mg | ORAL_TABLET | ORAL | 0 refills | Status: AC | PRN

## 2021-02-07 SURGICAL SUPPLY — 82 items
BANDAGE ESMARK 4X12 BL STRL LF (DISPOSABLE) ×1 IMPLANT
BB-TAK SMALL (MISCELLANEOUS) ×4
BIT DRILL 2 CANN GRADUATED (BIT) ×1 IMPLANT
BIT DRILL 2.5 CANN LNG (BIT) ×1 IMPLANT
BIT DRILL 2.6 CANN (BIT) ×1 IMPLANT
BLADE SURG SZ10 CARB STEEL (BLADE) ×2 IMPLANT
BNDG COHESIVE 4X5 TAN STRL (GAUZE/BANDAGES/DRESSINGS) ×2 IMPLANT
BNDG ELASTIC 4X5.8 VLCR NS LF (GAUZE/BANDAGES/DRESSINGS) ×3 IMPLANT
BNDG ELASTIC 4X5.8 VLCR STR LF (GAUZE/BANDAGES/DRESSINGS) ×1 IMPLANT
BNDG ESMARK 4X12 BLUE STRL LF (DISPOSABLE) ×2
CHLORAPREP W/TINT 26 (MISCELLANEOUS) ×2 IMPLANT
CLOTH BEACON ORANGE TIMEOUT ST (SAFETY) ×2 IMPLANT
COVER LIGHT HANDLE STERIS (MISCELLANEOUS) ×4 IMPLANT
CUFF TOURN SGL QUICK 34 (TOURNIQUET CUFF) ×2
CUFF TRNQT CYL 34X4.125X (TOURNIQUET CUFF) ×1 IMPLANT
DECANTER SPIKE VIAL GLASS SM (MISCELLANEOUS) IMPLANT
DRAPE C-ARM FOLDED MOBILE STRL (DRAPES) ×2 IMPLANT
DRAPE C-ARMOR (DRAPES) ×2 IMPLANT
DRAPE ORTHO 2.5IN SPLIT 77X108 (DRAPES) ×1 IMPLANT
DRAPE ORTHO SPLIT 77X108 STRL (DRAPES) ×1
DRILL 2.6X122MM WL AO SHAFT (BIT) IMPLANT
ELECT REM PT RETURN 9FT ADLT (ELECTROSURGICAL) ×2
ELECTRODE REM PT RTRN 9FT ADLT (ELECTROSURGICAL) ×1 IMPLANT
FIXATION BB TAK SMALL (MISCELLANEOUS) IMPLANT
GAUZE SPONGE 4X4 12PLY STRL (GAUZE/BANDAGES/DRESSINGS) ×2 IMPLANT
GAUZE XEROFORM 1X8 LF (GAUZE/BANDAGES/DRESSINGS) ×2 IMPLANT
GLOVE SRG 8 PF TXTR STRL LF DI (GLOVE) ×1 IMPLANT
GLOVE SURG POLYISO LF SZ8 (GLOVE) ×4 IMPLANT
GLOVE SURG UNDER POLY LF SZ7 (GLOVE) ×6 IMPLANT
GLOVE SURG UNDER POLY LF SZ8 (GLOVE) ×1
GOWN STRL REUS W/ TWL XL LVL3 (GOWN DISPOSABLE) ×1 IMPLANT
GOWN STRL REUS W/TWL LRG LVL3 (GOWN DISPOSABLE) ×4 IMPLANT
GOWN STRL REUS W/TWL XL LVL3 (GOWN DISPOSABLE) ×2
GUIDEWIRE 1.35MM (WIRE) ×1 IMPLANT
INST SET MINOR BONE (KITS) ×2 IMPLANT
K-WIRE 1.6X150 (WIRE)
K-WIRE FX150X1.6XKRSH (WIRE)
K-WIRE ORTHOPEDIC 1.4X150L (WIRE)
K-WIRE SMOOTH 2.0X150 (WIRE)
KIT TURNOVER KIT A (KITS) ×2 IMPLANT
KWIRE FX150X1.6XKRSH (WIRE) IMPLANT
KWIRE ORTHOPEDIC 1.4X150L (WIRE) IMPLANT
KWIRE SMOOTH 2.0X150 (WIRE) IMPLANT
MANIFOLD NEPTUNE II (INSTRUMENTS) ×2 IMPLANT
NDL HYPO 21X1.5 SAFETY (NEEDLE) IMPLANT
NEEDLE HYPO 21X1.5 SAFETY (NEEDLE) ×2 IMPLANT
NS IRRIG 1000ML POUR BTL (IV SOLUTION) ×2 IMPLANT
PACK BASIC LIMB (CUSTOM PROCEDURE TRAY) ×2 IMPLANT
PAD ABD 5X9 TENDERSORB (GAUZE/BANDAGES/DRESSINGS) ×8 IMPLANT
PAD ABD 8X10 STRL (GAUZE/BANDAGES/DRESSINGS) ×1 IMPLANT
PAD ARMBOARD 7.5X6 YLW CONV (MISCELLANEOUS) ×2 IMPLANT
PAD CAST 4YDX4 CTTN HI CHSV (CAST SUPPLIES) ×1 IMPLANT
PADDING CAST ABS 4INX4YD NS (CAST SUPPLIES) ×2
PADDING CAST ABS COTTON 4X4 ST (CAST SUPPLIES) ×2 IMPLANT
PADDING CAST COTTON 4X4 STRL (CAST SUPPLIES) ×2
PADDING WEBRIL 4 STERILE (GAUZE/BANDAGES/DRESSINGS) ×1 IMPLANT
PENCIL SMOKE EVACUATOR (MISCELLANEOUS) ×2 IMPLANT
PLATE DIST FB LT 5H (Plate) ×1 IMPLANT
PLATE LOCK MED HOOK 3H (Plate) ×1 IMPLANT
SCREW CANN 4.0X50 (Screw) ×2 IMPLANT
SCREW CANN T15 ST 50X4 ST (Screw) IMPLANT
SCREW CORT 3.5X40 LP ANKLE (Screw) ×1 IMPLANT
SCREW CORTICAL 2.7X18 LO PRO (Screw) ×1 IMPLANT
SCREW LOCKING 2.7X12 ANKLE (Screw) ×2 IMPLANT
SCREW LOW PROFILE 3.5X14 (Screw) ×2 IMPLANT
SCREW LOW PROFILE 3.5X35 (Screw) ×1 IMPLANT
SCREW NLOCK T15 FT 18X3.5XST (Screw) IMPLANT
SCREW NON LOCK 3.5X18MM (Screw) ×2 IMPLANT
SCREW TM SS 2.7X14 CORTEX (Screw) ×1 IMPLANT
SET BASIN LINEN APH (SET/KITS/TRAYS/PACK) ×2 IMPLANT
SPLINT PLASTER CAST XFAST 5X30 (CAST SUPPLIES) IMPLANT
SPLINT PLASTER XFAST SET 5X30 (CAST SUPPLIES) ×1
SPONGE GAUZE 4X4 12PLY STER LF (GAUZE/BANDAGES/DRESSINGS) ×1 IMPLANT
SPONGE T-LAP 18X18 ~~LOC~~+RFID (SPONGE) ×2 IMPLANT
STAPLER VISISTAT (STAPLE) IMPLANT
SUT ETHILON 3 0 FSL (SUTURE) ×4 IMPLANT
SUT MON AB 0 CT1 (SUTURE) IMPLANT
SUT MON AB 2-0 CT1 36 (SUTURE) ×1 IMPLANT
SUT VIC AB 2-0 CT1 27 (SUTURE)
SUT VIC AB 2-0 CT1 TAPERPNT 27 (SUTURE) ×1 IMPLANT
SYR 30ML LL (SYRINGE) IMPLANT
SYR BULB IRRIG 60ML STRL (SYRINGE) ×4 IMPLANT

## 2021-02-07 NOTE — Transfer of Care (Signed)
Immediate Anesthesia Transfer of Care Note  Patient: Cindy Randall  Procedure(s) Performed: OPEN REDUCTION INTERNAL FIXATION (ORIF) ANKLE FRACTURE (Left: Ankle)  Patient Location: PACU  Anesthesia Type:General and Regional  Level of Consciousness: drowsy, patient cooperative and responds to stimulation  Airway & Oxygen Therapy: Patient Spontanous Breathing  Post-op Assessment: Report given to RN, Post -op Vital signs reviewed and stable and Patient moving all extremities X 4  Post vital signs: Reviewed and stable  Last Vitals:  Vitals Value Taken Time  BP 120/69 02/07/21 0939  Temp    Pulse 87 02/07/21 0943  Resp 20 02/07/21 0943  SpO2 96 % 02/07/21 0943  Vitals shown include unvalidated device data.  Last Pain:  Vitals:   02/07/21 0644  TempSrc: Oral  PainSc: 0-No pain      Patients Stated Pain Goal: 6 (02/07/21 8366)  Complications: No notable events documented.

## 2021-02-07 NOTE — Anesthesia Procedure Notes (Addendum)
Anesthesia Regional Block: Popliteal block   Pre-Anesthetic Checklist: , timeout performed,  Correct Patient, Correct Site, Correct Laterality,  Correct Procedure, Correct Position, site marked,  Risks and benefits discussed,  Surgical consent,  Pre-op evaluation,  At surgeon's request and post-op pain management  Laterality: Left  Prep: chloraprep       Needles:  Injection technique: Single-shot  Needle Type: Stimiplex     Needle Length: 15cm  Needle Gauge: 20     Additional Needles:   Procedures:,,,, ultrasound used (permanent image in chart),,    Narrative:  Start time: 02/07/2021 7:12 AM End time: 02/07/2021 7:16 AM Injection made incrementally with aspirations every 5 mL.  Performed by: With CRNAs  Anesthesiologist: Windell Norfolk, MD CRNA: Brynda Peon, CRNA

## 2021-02-07 NOTE — Anesthesia Postprocedure Evaluation (Signed)
Anesthesia Post Note  Patient: Cindy Randall  Procedure(s) Performed: OPEN REDUCTION INTERNAL FIXATION (ORIF) ANKLE FRACTURE (Left: Ankle)  Patient location during evaluation: Phase II Anesthesia Type: General Level of consciousness: awake Pain management: pain level controlled Vital Signs Assessment: post-procedure vital signs reviewed and stable Respiratory status: spontaneous breathing and respiratory function stable Cardiovascular status: blood pressure returned to baseline and stable Postop Assessment: no headache and no apparent nausea or vomiting Anesthetic complications: no Comments: Late entry   No notable events documented.   Last Vitals:  Vitals:   02/07/21 1030 02/07/21 1045  BP: 117/74 (!) 115/56  Pulse: 84 96  Resp: 20 18  Temp:  36.7 C  SpO2: 97% 100%    Last Pain:  Vitals:   02/07/21 1045  TempSrc: Oral  PainSc: 0-No pain                 Windell Norfolk

## 2021-02-07 NOTE — Interval H&P Note (Signed)
History and Physical Interval Note:  02/07/2021 7:12 AM  Montgomery D Leveille  has presented today for surgery, with the diagnosis of Left bimalleolar ankle fracture.  The various methods of treatment have been discussed with the patient and family. After consideration of risks, benefits and other options for treatment, the patient has consented to  Procedure(s) with comments: OPEN REDUCTION INTERNAL FIXATION (ORIF) ANKLE FRACTURE (Left) - Bimalleolar ankle fracture as a surgical intervention.  The patient's history has been reviewed, patient examined, no change in status, stable for surgery.  I have reviewed the patient's chart and labs.  Questions were answered to the patient's satisfaction.     Oliver Barre

## 2021-02-07 NOTE — Progress Notes (Signed)
Saphenous and popliteal nerve block left   Time out 7:12  Start 7:12 Stop  7:20

## 2021-02-07 NOTE — Discharge Instructions (Signed)
Shailey Butterbaugh A. Finesse Fielder, MD MS [facility] [address] [place],    [address] Phone: [phone] Fax: [phone]   POST-OPERATIVE INSTRUCTIONS - LOWER EXTREMITY   WOUND CARE Please keep splint clean dry and intact until followup.  You may shower on Post-Op Day #2.  You must keep splint dry during this process and may find that a plastic bag taped around the leg or alternatively a towel based bath may be a better option.   If you get your splint wet or if it is damaged please contact our clinic.  EXERCISES Due to your splint being in place you will not be able to bear weight through your extremity.   DO NOT PUT ANY WEIGHT ON YOUR OPERATIVE LEG Please use crutches or a walker to avoid weight bearing.   REGIONAL ANESTHESIA (NERVE BLOCKS) The anesthesia team may have performed a nerve block for you if safe in the setting of your care.  This is a great tool used to minimize pain.  Typically the block may start wearing off overnight but the long acting medicine may last for 3-4 days.  The nerve block wearing off can be a challenging period but please utilize your as needed pain medications to try and manage this period.    POST-OP MEDICATIONS- Multimodal approach to pain control  In general your pain will be controlled with a combination of substances.  Prescriptions unless otherwise discussed are electronically sent to your pharmacy.  This is a carefully made plan we use to minimize narcotic use.      - Celebrex - Anti-inflammatory medication taken on a scheduled basis  - Acetaminophen - Non-narcotic pain medicine taken on a scheduled basis   - Oxycodone - This is a strong narcotic, to be used only on an "as needed" basis for pain.  -  Aspirin 81mg - This medicine is used to minimize the risk of blood clots after surgery.             -          Zofran - take as needed for nausea   FOLLOW-UP If you develop a Fever (>101.5), Redness or Drainage from the  surgical incision site, please call our office to arrange for an evaluation. Please call the office to schedule a follow-up appointment for your incision check if you do not already have one, 10-14 days post-operatively.  IF YOU HAVE ANY QUESTIONS, PLEASE FEEL FREE TO CALL OUR OFFICE.  HELPFUL INFORMATION  If you had a block, it will wear off between 8-24 hrs postop typically.  This is period when your pain may go from nearly zero to the pain you would have had postop without the block.  This is an abrupt transition but nothing dangerous is happening.  You may take an extra dose of narcotic when this happens.  You should wean off your narcotic medicines as soon as you are able.  Most patients will be off or using minimal narcotics before their first postop appointment.   Elevating your leg will help with swelling and pain control.  You are encouraged to elevate your leg as much as possible in the first couple of weeks following surgery.  Imagine a drop of water on your toe, and your goal is to get that water back to your heart.  We suggest you use the pain medication the first night prior to going to bed, in order to ease any pain when the anesthesia wears off. You   should avoid taking pain medications on an empty stomach as it will make you nauseous.  Do not drink alcoholic beverages or take illicit drugs when taking pain medications.  In most states it is against the law to drive while you are in a splint or sling.  And certainly against the law to drive while taking narcotics.  You may return to work/school in the next couple of days when you feel up to it.   Pain medication may make you constipated.  Below are a few solutions to try in this order: Decrease the amount of pain medication if you aren't having pain. Drink lots of decaffeinated fluids. Drink prune juice and/or each dried prunes  If the first 3 don't work start with additional solutions Take Colace - an over-the-counter stool  softener Take Senokot - an over-the-counter laxative Take Miralax - a stronger over-the-counter laxative  

## 2021-02-07 NOTE — Anesthesia Preprocedure Evaluation (Signed)
Anesthesia Evaluation  Patient identified by MRN, date of birth, ID band Patient awake    Reviewed: Allergy & Precautions, H&P , NPO status , Patient's Chart, lab work & pertinent test results, reviewed documented beta blocker date and time   Airway Mallampati: II  TM Distance: >3 FB Neck ROM: full    Dental no notable dental hx.    Pulmonary neg pulmonary ROS,    Pulmonary exam normal breath sounds clear to auscultation       Cardiovascular Exercise Tolerance: Good negative cardio ROS   Rhythm:regular Rate:Normal     Neuro/Psych negative neurological ROS  negative psych ROS   GI/Hepatic negative GI ROS, Neg liver ROS,   Endo/Other  negative endocrine ROS  Renal/GU negative Renal ROS  negative genitourinary   Musculoskeletal   Abdominal   Peds  Hematology negative hematology ROS (+)   Anesthesia Other Findings   Reproductive/Obstetrics negative OB ROS                             Anesthesia Physical Anesthesia Plan  ASA: 1  Anesthesia Plan: General   Post-op Pain Management:  Regional for Post-op pain and GA combined w/ Regional for post-op pain   Induction:   PONV Risk Score and Plan: Ondansetron  Airway Management Planned:   Additional Equipment:   Intra-op Plan:   Post-operative Plan:   Informed Consent: I have reviewed the patients History and Physical, chart, labs and discussed the procedure including the risks, benefits and alternatives for the proposed anesthesia with the patient or authorized representative who has indicated his/her understanding and acceptance.     Dental Advisory Given  Plan Discussed with: CRNA  Anesthesia Plan Comments:         Anesthesia Quick Evaluation

## 2021-02-07 NOTE — Anesthesia Procedure Notes (Signed)
Procedure Name: LMA Insertion Date/Time: 02/07/2021 7:34 AM Performed by: Brynda Peon, CRNA Pre-anesthesia Checklist: Patient identified, Emergency Drugs available, Suction available, Patient being monitored and Timeout performed Patient Re-evaluated:Patient Re-evaluated prior to induction Oxygen Delivery Method: Circle system utilized Preoxygenation: Pre-oxygenation with 100% oxygen Induction Type: IV induction LMA: LMA inserted LMA Size: 3.0 Number of attempts: 1 Placement Confirmation: positive ETCO2, CO2 detector and breath sounds checked- equal and bilateral Tube secured with: Tape Dental Injury: Teeth and Oropharynx as per pre-operative assessment
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
## 2021-02-07 NOTE — Anesthesia Procedure Notes (Signed)
Anesthesia Regional Block: Adductor canal block   Pre-Anesthetic Checklist: , timeout performed,  Correct Patient, Correct Site, Correct Laterality,  Correct Procedure, Correct Position, site marked,  Risks and benefits discussed,  Surgical consent,  Pre-op evaluation,  At surgeon's request and post-op pain management  Laterality: Left  Prep: chloraprep       Needles:  Injection technique: Single-shot  Needle Type: Stimulator Needle - 40     Needle Length: 15cm  Needle Gauge: 20     Additional Needles:   Procedures:,,,, ultrasound used (permanent image in chart),,    Narrative:  Start time: 02/07/2021 7:18 AM End time: 02/07/2021 7:20 AM Injection made incrementally with aspirations every 5 mL.  Performed by: With CRNAs  Anesthesiologist: Windell Norfolk, MD CRNA: Brynda Peon, CRNA

## 2021-02-08 ENCOUNTER — Encounter (HOSPITAL_COMMUNITY): Payer: Self-pay | Admitting: Orthopedic Surgery

## 2021-02-19 ENCOUNTER — Other Ambulatory Visit: Payer: Self-pay

## 2021-02-19 ENCOUNTER — Ambulatory Visit (INDEPENDENT_AMBULATORY_CARE_PROVIDER_SITE_OTHER): Payer: Self-pay | Admitting: Orthopedic Surgery

## 2021-02-19 ENCOUNTER — Ambulatory Visit: Payer: Medicaid Other

## 2021-02-19 ENCOUNTER — Encounter: Payer: Self-pay | Admitting: Orthopedic Surgery

## 2021-02-19 ENCOUNTER — Other Ambulatory Visit: Payer: Self-pay | Admitting: Orthopedic Surgery

## 2021-02-19 DIAGNOSIS — S82892D Other fracture of left lower leg, subsequent encounter for closed fracture with routine healing: Secondary | ICD-10-CM

## 2021-02-19 MED ORDER — OXYCODONE HCL 5 MG PO TABS: 5.0000 mg | ORAL_TABLET | Freq: Four times a day (QID) | ORAL | 0 refills | Status: DC | PRN

## 2021-02-19 NOTE — Patient Instructions (Signed)

## 2021-02-19 NOTE — Progress Notes (Signed)
Orthopaedic Postop Note  Assessment: Cindy Randall is a 20 y.o. female s/p ORIF of Left ankle fracture  DOS: 02/07/2021  Plan: Sutures removed, steri strips placed Well padded short leg cast placed in clinic today; reminded to leave in place - contact clinic if there are issues NWB on the operative extremity Elevate the leg to improve swelling of foot and ankle.  Continue to take Aspirin 81 mg BID  Cast application - left short leg cast   Verbal consent was obtained and the correct extremity was identified. A well padded, appropriately molded short arm cast was applied to the right arm Fingers remained warm and well perfused.   There were no sharp edges Patient tolerated the procedure well Cast care instructions were provided    Follow-up: Return in about 2 weeks (around 03/05/2021). XR at next visit: Left ankle  Subjective:  Chief Complaint  Patient presents with   Routine Post Op    Lt ankle ORIF DOS 02/07/21    History of Present Illness: Cindy Randall is a 20 y.o. female who presents following the above stated procedure.  She is now approximately 2 weeks out from surgery.  She presented to clinic today without the plaster splint in place.   She had also removed the plaster splint prior to surgery.  She has remained NWB on the left leg.  Her pain is controlled.  She is still taking oxycodone, but infrequently, she states.  She did request another prescription today.  Review of Systems: No fevers or chills No numbness or tingling No Chest Pain No shortness of breath   Objective: LMP 02/03/2021 (Exact Date)   Physical Exam:  Alert and oriented, no acute distress  Left ankle incisions are healing well.  No surrounding erythema or drainage.  Swelling over the dorsum of her foot.  She has some superficial lacerations that are healing well.  Heel cord is tight; dorsiflexion to just short of neutral.  Sensation intact over the dorsum of her foot.  2+ DP pulse.   Toes are warm and well perfused.   IMAGING: I personally ordered and reviewed the following images  XR of the left ankle was obtained in clinic today and demonstrates maintenance of alignment of the bimalleolar ankle fracture.  Hardware remains intact and has not shifted.  No evidence of hardware failure or loosening.  Mortise is intact.  No syndesmotic disruption.   Impression: Left bimalleolar ankle fracture following ORIF without hardware failure   Oliver Barre, MD 02/19/2021 10:15 PM

## 2021-03-05 ENCOUNTER — Other Ambulatory Visit: Payer: Self-pay

## 2021-03-05 ENCOUNTER — Ambulatory Visit (INDEPENDENT_AMBULATORY_CARE_PROVIDER_SITE_OTHER): Payer: Self-pay | Admitting: Orthopedic Surgery

## 2021-03-05 ENCOUNTER — Encounter: Payer: Self-pay | Admitting: Orthopedic Surgery

## 2021-03-05 ENCOUNTER — Ambulatory Visit: Payer: Medicaid Other

## 2021-03-05 DIAGNOSIS — S82892D Other fracture of left lower leg, subsequent encounter for closed fracture with routine healing: Secondary | ICD-10-CM

## 2021-03-05 NOTE — Patient Instructions (Signed)

## 2021-03-05 NOTE — Progress Notes (Signed)
Orthopaedic Postop Note  Assessment: Cindy Randall is a 20 y.o. female s/p ORIF of Left ankle fracture  DOS: 02/07/2021  Plan: Patient's pain and swelling continues to improve.  No tenderness to palpation on physical exam.  Radiographs remained stable, unchanged from previous x-rays.  We will place her on a little more short leg cast, with plan to transition to a walking boot at the next visit.  Continue to take Aspirin 81 mg BID.  She is no longer taking any narcotic pain medications, and is not requesting a refill in clinic today.  Cast application - left short leg cast   Verbal consent was obtained and the correct extremity was identified. A well padded, appropriately molded short arm cast was applied to the right arm Fingers remained warm and well perfused.   There were no sharp edges Patient tolerated the procedure well Cast care instructions were provided    Follow-up: Return in about 2 weeks (around 03/19/2021). XR at next visit: Left ankle  Subjective:  Chief Complaint  Patient presents with   Ankle Pain    Fx care//dos 02/07/21    History of Present Illness: Cindy Randall is a 20 y.o. female who presents following the above stated procedure.  Surgery was approximately 4 weeks ago.  She continues to do well.  Her pain is improving.  She is no longer taking narcotic pain medications on a regular basis.  She denies fevers and chills.  No numbness or tingling.  Review of Systems: No fevers or chills No numbness or tingling No Chest Pain No shortness of breath   Objective: LMP 02/28/2021 (Exact Date)   Physical Exam:  Alert and oriented, no acute distress  Surgical incisions are healing well, both medial and lateral ankle.  No tenderness to palpation along either surgical incision.  No surrounding erythema or drainage.  Sensation is intact to the dorsum of the foot.  Active motion intact in the EHL/TA.  2+ DP pulse.  Visible wrinkles on the dorsum of her  foot.  IMAGING: I personally ordered and reviewed the following images  X-ray of the left ankle were obtained in clinic today and demonstrates stable alignment overall.  No interval displacement.  There has been interval consolidation of the fracture sites.  Mortise remains intact.  No syndesmotic disruption.  No hardware failure or loosening.  Impression: Left bimalleolar ankle fracture in stable alignment following operative fixation  Oliver Barre, MD 03/05/2021 12:43 PM

## 2021-03-19 ENCOUNTER — Other Ambulatory Visit: Payer: Self-pay

## 2021-03-19 ENCOUNTER — Encounter: Payer: Self-pay | Admitting: Orthopedic Surgery

## 2021-03-19 ENCOUNTER — Ambulatory Visit (INDEPENDENT_AMBULATORY_CARE_PROVIDER_SITE_OTHER): Payer: Self-pay | Admitting: Orthopedic Surgery

## 2021-03-19 ENCOUNTER — Ambulatory Visit: Payer: Medicaid Other

## 2021-03-19 VITALS — Ht 62.0 in | Wt 123.0 lb

## 2021-03-19 DIAGNOSIS — S82892D Other fracture of left lower leg, subsequent encounter for closed fracture with routine healing: Secondary | ICD-10-CM

## 2021-03-19 NOTE — Patient Instructions (Addendum)
Transition to a walking boot.    Please keep boot on except for hygiene or range of motion exercises for the next 2 weeks  IN 2 weeks you can start putting weight on your ankle, in the boot.  Ok to start working on range of motion exercises, listed below.  Please do not start with strengthening yet   Follow up in 1 month    Ankle Exercises Ask your health care provider which exercises are safe for you. Do exercises exactly as told by your health care provider and adjust them as directed. It is normal to feel mild stretching, pulling, tightness, or mild discomfort as you do these exercises. Stop right away if you feel sudden pain or your pain gets worse. Do not begin these exercises until told by your health care provider.  Stretching and range-of-motion exercises These exercises warm up your muscles and joints and improve the movement and flexibility of your ankle. These exercises may also help to relieve pain.  Dorsiflexion/plantar flexion  Sit with your L knee straight or bent. Do not rest your foot on anything. Flex your left ankle to tilt the top of your foot toward your shin. This is called dorsiflexion. Hold this position for 5 seconds. Point your toes downward to tilt the top of your foot away from your shin. This is called plantar flexion. Hold this position for 5 seconds. Repeat 10 times. Complete this exercise 2-3 times a day.  As tolerated  Ankle alphabet  Sit with your L foot supported at your lower leg. Do not rest your foot on anything. Make sure your foot has room to move freely. Think of your L foot as a paintbrush: Move your foot to trace each letter of the alphabet in the air. Keep your hip and knee still while you trace the letters. Trace every letter from A to Z. Make the letters as large as you can without causing or increasing any discomfort.  Repeat 2-3 times. Complete this exercise 2-3 times a day.   Strengthening exercises These exercises build  strength and endurance in your ankle. Endurance is the ability to use your muscles for a long time, even after they get tired. Dorsiflexors These are muscles that lift your foot up. Secure a rubber exercise band or tube to an object, such as a table leg, that will stay still when the band is pulled. Secure the other end around your L foot. Sit on the floor, facing the object with your L leg extended. The band or tube should be slightly tense when your foot is relaxed. Slowly flex your L ankle and toes to bring your foot toward your shin. Hold this position for 5 seconds. Slowly return your foot to the starting position, controlling the band as you do that. Repeat 10 times. Complete this exercise 2-3 times a day.  Plantar flexors These are muscles that push your foot down. Sit on the floor with your L leg extended. Loop a rubber exercise band or tube around the ball of your L foot. The ball of your foot is on the walking surface, right under your toes. The band or tube should be slightly tense when your foot is relaxed. Slowly point your toes downward, pushing them away from you. Hold this position for 5 seconds. Slowly release the tension in the band or tube, controlling smoothly until your foot is back in the starting position. Repeat 10 times. Complete this exercise 2-3 times a day.  Towel curls  Sit  in a chair on a non-carpeted surface, and put your feet on the floor. Place a towel in front of your feet. Keeping your heel on the floor, put your L foot on the towel. Pull the towel toward you by grabbing the towel with your toes and curling them under. Keep your heel on the floor. Let your toes relax. Grab the towel again. Keep pulling the towel until it is completely underneath your foot. Repeat 10 times. Complete this exercise 2-3 times a day.  Standing plantar flexion This is an exercise in which you use your toes to lift your body's weight while standing. Stand with your feet  shoulder-width apart. Keep your weight spread evenly over the width of your feet while you rise up on your toes. Use a wall or table to steady yourself if needed, but try not to use it for support. If this exercise is too easy, try these options: Shift your weight toward your L leg until you feel challenged. If told by your health care provider, lift your uninjured leg off the floor. Hold this position for 5 seconds. Repeat 10 times. Complete this exercise 2-3 times a day.  Tandem walking Stand with one foot directly in front of the other. Slowly raise your back foot up, lifting your heel before your toes, and place it directly in front of your other foot. Continue to walk in this heel-to-toe way. Have a countertop or wall nearby to use if needed to keep your balance, but try not to hold onto anything for support.  Repeat 10 times. Complete this exercise 2-3 times a day.   Document Revised: 02/13/2018 Document Reviewed: 02/15/2018 Elsevier Patient Education  2020 [employer].

## 2021-03-19 NOTE — Progress Notes (Signed)
Orthopaedic Postop Note  Assessment: RIA Cindy Randall is a 20 y.o. female s/p ORIF of Left ankle fracture  DOS: 02/07/2021  Plan: Is feeling better.  She does not take any pain medications.  Radiographs obtained in clinic today demonstrates stable overall alignment.  She does continue to have some residual swelling.  She has not been weightbearing.  We will transition her to a walking boot in clinic today.  She will remain nonweightbearing for an additional 2 weeks, and then she can start to advance her weightbearing.  Okay to shower, let soap and water run over the incisions.  She is aware that she should not scrub the incisions.  We will see her back in 4 weeks.   Follow-up: Return in about 4 weeks (around 04/16/2021). XR at next visit: Left ankle  Subjective:  Chief Complaint  Patient presents with   Routine Post Op    Lt ankle DOS  02/07/21    History of Present Illness: Cindy Randall is a 20 y.o. female who presents following the above stated procedure.  She is recovering well.  She denies pain.  She does note some swelling, upon removal of the cast.  She is not taking pain medications.  No numbness or tingling.  No complaints at this time.  Review of Systems: No fevers or chills No numbness or tingling No Chest Pain No shortness of breath   Objective: Ht 5\' 2"  (1.575 m)   Wt 123 lb (55.8 kg)   LMP 02/28/2021 (Exact Date)   BMI 22.50 kg/m   Physical Exam:  Alert and oriented, no acute distress  Overall, the surgical incisions are healing well.  No surrounding erythema or drainage.  She does have some dry skin, and some residual scabbing at the incision sites.  Mild swelling over the medial ankle.  No tenderness to palpation along the medial malleolus, or the lateral malleolus.  She tolerates dorsiflexion to a plantigrade position.  2+ DP pulse.  Sensation is intact throughout the dorsum of her foot.  IMAGING: I personally ordered and reviewed the following  images  X-ray left ankle was obtained in clinic today and demonstrates stable overall alignment.  There is been no interval displacement of the medial or lateral malleolus fractures.  There has been interval consolidation of all fractures.  Mortise remains intact.  No syndesmotic disruption.  No other acute injuries are noted.  Impression: Healing left ankle fracture following operative fixation.  03/02/2021, MD 03/19/2021 12:30 PM

## 2021-04-16 ENCOUNTER — Other Ambulatory Visit: Payer: Self-pay

## 2021-04-16 ENCOUNTER — Encounter: Payer: Self-pay | Admitting: Orthopedic Surgery

## 2021-04-16 ENCOUNTER — Ambulatory Visit (INDEPENDENT_AMBULATORY_CARE_PROVIDER_SITE_OTHER): Payer: Self-pay | Admitting: Orthopedic Surgery

## 2021-04-16 ENCOUNTER — Ambulatory Visit: Payer: Medicaid Other

## 2021-04-16 VITALS — Ht 62.0 in | Wt 123.0 lb

## 2021-04-16 DIAGNOSIS — S82892D Other fracture of left lower leg, subsequent encounter for closed fracture with routine healing: Secondary | ICD-10-CM

## 2021-04-16 NOTE — Progress Notes (Signed)
Orthopaedic Postop Note  Assessment: Cindy Randall is a 20 y.o. female s/p ORIF of Left ankle fracture  DOS: 02/07/2021  Plan: Patient's left ankle continues to improve.  She notes occasional swelling.  She has been ambulating without crutches, while wearing the cam walker boot.  She has not attempted to walk without the boot on yet.  I have encouraged her to transition out of the walking boot, into a regular shoe over the next 2-3 weeks.  She is reluctant, but I assured her that everything has looked good thus far.  She states her understanding.  We will see her back in approximately 2 months.   Follow-up: Return in about 8 weeks (around 06/10/2021). XR at next visit: Left ankle  Subjective:  Chief Complaint  Patient presents with   Routine Post Op    Lt ankle DOS 02/07/21    History of Present Illness: Cindy Randall is a 20 y.o. female who presents following the above stated procedure.  She denies pain.  She is ambulating well without crutches, wearing a walking boot.  Occasionally, she notes that she has been on her feet for too long, but her pain and swelling improves when she gets off of her feet.  She has not tried walking in a regular shoe yet.  She does not take any medications for pain.  With the recent change in weather, she notes a slight increase in the aching sensation she has in the left ankle.  No issues with her surgical incisions.  Review of Systems: No fevers or chills No numbness or tingling No Chest Pain No shortness of breath   Objective: Ht 5\' 2"  (1.575 m)   Wt 123 lb (55.8 kg)   BMI 22.50 kg/m   Physical Exam:  Alert and oriented, no acute distress  Ambulating without the assistance of crutches.  Wearing a walking boot.  Surgical incisions are healing well.  No surrounding erythema or drainage.  There is some slight prominence of the hardware over both medial and lateral ankle.  No tenderness to palpation along the hardware, or the surgical  incisions.  She gets to a plantigrade position, but has tightness in the heel cord otherwise.  Sensations intact over the dorsum of her foot.  Slight discoloration over the anterior lateral aspect of her foot.  Toes are warm and well-perfused.  2+ DP pulse.  IMAGING: I personally ordered and reviewed the following images  X-ray of the left ankle was obtained in clinic today and demonstrates no interval change in the overall alignment.  There has been interval consolidation.  No evidence of hardware failure or loosening.  The mortise remains intact.  No syndesmotic disruption.  Impression: Healing left ankle fracture, without hardware failure or complication.   , MD 04/16/2021 11:42 AM

## 2021-06-02 NOTE — L&D Delivery Note (Signed)
[facility] Delivery Note  Cindy Randall is a 21 y.o. G1P0 s/p vag del at [redacted]w[redacted]d. She was admitted for IOL due to newly dx gHTN and FGR at her office visit 04/04/22.   ROM: 2h 94m with clear fluid GBS Status: neg Maximum Maternal Temperature: 98.2  Labor Progress: Ms Cindy was sent from the office for IOL due to newly dx gHTN (neg labs/asymptomatic) and FGR (EFW 2.8% w nl dopplers); she had a single dose of vaginal cytotec prior to progressing into active labor and getting an epidural; at 10cm her membranes were ruptured.  Delivery Date/Time: April 05, 2022 at 0224 Delivery: Called to room and patient was complete and pushing. Head delivered ROA. Nuchal cord present x 1, reduced after delivery. Shoulder and body delivered in usual fashion. Infant with spontaneous cry, placed on mother's abdomen, dried and stimulated. Cord clamped x 2 after 1-minute delay, and cut by FOB. Cord blood drawn. Placenta delivered spontaneously with gentle cord traction. Fundus firm with massage and Pitocin. Labia, perineum, vagina, and cervix inspected and found to have a 1st deg perineal lac.   Placenta: spont, intact; to L&D Complications: none Lacerations: 1st deg perineal lac, repaired in the usual fashion w 3-0 Vicryl Rapide and epidural for anesthesia EBL: 173cc Analgesia: epidural  Postpartum Planning [x]  message to sent to schedule follow-up   Infant: boy  APGARs 9/9  2693g (5lb 15oz)  Myrtis Randall, CNM  04/05/2022 3:08 AM

## 2021-06-11 ENCOUNTER — Encounter: Payer: Self-pay | Admitting: Orthopedic Surgery

## 2021-06-11 ENCOUNTER — Ambulatory Visit: Payer: Medicaid Other | Admitting: Orthopedic Surgery

## 2021-08-16 ENCOUNTER — Encounter: Payer: Self-pay | Admitting: Orthopedic Surgery

## 2021-08-28 ENCOUNTER — Telehealth: Payer: Self-pay | Admitting: Women's Health

## 2021-08-28 NOTE — Telephone Encounter (Signed)
Attempted to call patient back. No answer. Sent [employer]. ?

## 2021-08-28 NOTE — Telephone Encounter (Signed)
Patient called stating that she is early pregnant and has not had her dating [place] yet. Patient states that she is slightly bleeding and is concern and would like a call back from the nurse. Please contact pt ?

## 2021-09-05 ENCOUNTER — Other Ambulatory Visit: Payer: Self-pay | Admitting: Obstetrics & Gynecology

## 2021-09-05 DIAGNOSIS — O3680X0 Pregnancy with inconclusive fetal viability, not applicable or unspecified: Secondary | ICD-10-CM

## 2021-09-09 ENCOUNTER — Ambulatory Visit (INDEPENDENT_AMBULATORY_CARE_PROVIDER_SITE_OTHER): Payer: Medicaid Other

## 2021-09-09 DIAGNOSIS — O3680X0 Pregnancy with inconclusive fetal viability, not applicable or unspecified: Secondary | ICD-10-CM | POA: Diagnosis not present

## 2021-09-09 DIAGNOSIS — Z3A11 11 weeks gestation of pregnancy: Secondary | ICD-10-CM

## 2021-09-09 NOTE — Progress Notes (Signed)
[place] 9+4 wks,single IUP with yolk sac,CRL 27.59 mm,FHR 176 bpm,normal right ovary,simple left corpus luteal cyst 2.8 x 2.9 x 2.4 cm ?

## 2021-10-01 ENCOUNTER — Other Ambulatory Visit: Payer: Self-pay | Admitting: Obstetrics & Gynecology

## 2021-10-01 DIAGNOSIS — Z3682 Encounter for antenatal screening for nuchal translucency: Secondary | ICD-10-CM

## 2021-10-01 DIAGNOSIS — Z3400 Encounter for supervision of normal first pregnancy, unspecified trimester: Secondary | ICD-10-CM | POA: Insufficient documentation

## 2021-10-02 ENCOUNTER — Ambulatory Visit: Payer: Medicaid Other | Admitting: *Deleted

## 2021-10-02 ENCOUNTER — Ambulatory Visit (INDEPENDENT_AMBULATORY_CARE_PROVIDER_SITE_OTHER): Payer: Medicaid Other | Admitting: Obstetrics & Gynecology

## 2021-10-02 ENCOUNTER — Encounter: Payer: Self-pay | Admitting: Obstetrics & Gynecology

## 2021-10-02 ENCOUNTER — Ambulatory Visit (INDEPENDENT_AMBULATORY_CARE_PROVIDER_SITE_OTHER): Payer: Medicaid Other

## 2021-10-02 ENCOUNTER — Other Ambulatory Visit (HOSPITAL_COMMUNITY)
Admission: RE | Admit: 2021-10-02 | Discharge: 2021-10-02 | Disposition: A | Discharge status: Home / Self Care | Payer: Medicaid Other | Source: Ambulatory Visit | Attending: Obstetrics & Gynecology | Admitting: Obstetrics & Gynecology

## 2021-10-02 VITALS — BP 110/78 | HR 84 | Wt 121.0 lb

## 2021-10-02 DIAGNOSIS — Z113 Encounter for screening for infections with a predominantly sexual mode of transmission: Secondary | ICD-10-CM | POA: Diagnosis present

## 2021-10-02 DIAGNOSIS — Z3A12 12 weeks gestation of pregnancy: Secondary | ICD-10-CM | POA: Insufficient documentation

## 2021-10-02 DIAGNOSIS — Z3401 Encounter for supervision of normal first pregnancy, first trimester: Secondary | ICD-10-CM | POA: Diagnosis present

## 2021-10-02 DIAGNOSIS — Z3682 Encounter for antenatal screening for nuchal translucency: Secondary | ICD-10-CM

## 2021-10-02 DIAGNOSIS — Z124 Encounter for screening for malignant neoplasm of cervix: Secondary | ICD-10-CM | POA: Insufficient documentation

## 2021-10-02 DIAGNOSIS — Z3402 Encounter for supervision of normal first pregnancy, second trimester: Secondary | ICD-10-CM

## 2021-10-02 LAB — POCT URINALYSIS DIPSTICK OB
Blood, UA: NEGATIVE
Glucose, UA: NEGATIVE
Ketones, UA: NEGATIVE
Leukocytes, UA: NEGATIVE
Nitrite, UA: NEGATIVE
POC,PROTEIN,UA: NEGATIVE

## 2021-10-02 MED ORDER — ASPIRIN EC 81 MG PO TBEC: 81.0000 mg | DELAYED_RELEASE_TABLET | Freq: Every day | ORAL | 11 refills | Status: AC

## 2021-10-02 NOTE — Progress Notes (Signed)
? ?INITIAL OBSTETRICAL VISIT ?Patient name: Cindy Randall MRN [id number]  Date of birth: [birth year] ?Chief Complaint:   ?Initial Prenatal Visit ? ?History of Present Illness:   ?Cindy Randall is a 21 y.o. G1P0 African American female at [redacted]w[redacted]d by [place] at 9 weeks with an Estimated Date of Delivery: 04/10/22 being seen today for her initial obstetrical visit.   ?Her obstetrical history is significant for  uncomplicated .   ?Today she reports no complaints.  ? ?  10/02/2021  ? 11:19 AM  ?Depression screen PHQ 2/9  ?Decreased Interest 0  ?Down, Depressed, Hopeless 1  ?PHQ - 2 Score 1  ?Altered sleeping 1  ?Tired, decreased energy 0  ?Change in appetite 0  ?Feeling bad or failure about yourself  0  ?Trouble concentrating 0  ?Moving slowly or fidgety/restless 0  ?Suicidal thoughts 0  ?PHQ-9 Score 2  ? ? ?Patient's last menstrual period was 06/22/2021. ?Last pap completed today ? ?Review of Systems:   ?Pertinent items are noted in HPI ?Denies cramping/contractions, leakage of fluid, vaginal bleeding, abnormal vaginal discharge w/ itching/odor/irritation, headaches, visual changes, shortness of breath, chest pain, abdominal pain, severe nausea/vomiting, or problems with urination or bowel movements unless otherwise stated above.  ?Pertinent History Reviewed:  ?Reviewed past medical,surgical, social, obstetrical and family history.  ?Reviewed problem list, medications and allergies. ?OB History  ?Gravida Para Term Preterm AB Living  ?1            ?SAB IAB Ectopic Multiple Live Births  ?           ?  ?# Outcome Date GA Lbr Len/2nd Weight Sex Delivery Anes PTL Lv  ?1 Current           ? ?Physical Assessment:  ? ?Vitals:  ? 10/02/21 1031  ?BP: 110/78  ?Pulse: 84  ?Weight: 121 lb (54.9 kg)  ?Body mass index is 22.13 kg/m?. ? ?     Physical Examination: ? General appearance - well appearing, and in no distress ? Mental status - alert, oriented to person, place, and time ? Psych:  She has a normal mood and affect ? Skin - warm  and dry, normal color, no suspicious lesions noted ? Chest - effort normal, all lung fields clear to auscultation bilaterally ? Heart - normal rate and regular rhythm ? Abdomen - soft, nontender ? Extremities:  No swelling or varicosities noted ? Pelvic - VULVA: normal appearing vulva with no masses, tenderness or lesions  VAGINA: normal appearing vagina with normal color and discharge, no lesions  CERVIX: normal appearing cervix without discharge or lesions, no CMT ? Thin prep pap is done with HR HPV cotesting ? ?Chaperone: Glenard Haring [place]   ? ?TODAY'S NT - [place] 12+6 wks,measurements c/w dates,CRL 64.47 mm,FHR 164 bpm,posterior placenta,normal ovaries,NB present,NT 1.5 mm ? ?Results for orders placed or performed in visit on 10/02/21 (from the past 24 hour(s))  ?POC Urinalysis Dipstick OB  ? Collection Time: 10/02/21 11:16 AM  ?Result Value Ref Range  ? Color, UA    ? Clarity, UA    ? Glucose, UA Negative Negative  ? Bilirubin, UA    ? Ketones, UA neg   ? Spec Grav, UA    ? Blood, UA neg   ? pH, UA    ? POC,PROTEIN,UA Negative Negative, Trace, Small (1+), Moderate (2+), Large (3+), 4+  ? Urobilinogen, UA    ? Nitrite, UA neg   ? Leukocytes, UA Negative Negative  ? Appearance    ?  Odor    ?  ?Assessment & Plan:  ?1) Low-Risk Pregnancy G1P0 at 102w6d with an Estimated Date of Delivery: 04/10/22  ? ?2) Initial OB visit ? ?3) ASA daily- starting @ 16wks ? ?Meds:  ?Meds ordered this encounter  ?Medications  ? aspirin EC 81 MG tablet  ?  Sig: Take 1 tablet (81 mg total) by mouth daily. Swallow whole.  ?  Dispense:  30 tablet  ?  Refill:  11  ? ? ?Initial labs obtained ?Continue prenatal vitamins ?Reviewed n/v relief measures and warning s/s to report ?Reviewed recommended weight gain based on pre-gravid BMI ?Encouraged well-balanced diet ?Genetic & carrier screening discussed: requests Panorama,  ?Ultrasound discussed; fetal survey: results reviewed ?CCNC completed> form faxed if has or is planning to apply for medicaid ?The  nature of [place] for [facility] with multiple MDs and other Advanced Practice Providers was explained to patient; also emphasized that fellows, residents, and students are part of our team. ?Pt has home bp cuff.  ? ?Indications for ASA therapy (per uptodate) ?One of the following: ?H/O preeclampsia, especially early onset/adverse outcome No ?Multifetal gestation No ?CHTN No ?T1DM or T2DM No ?Chronic kidney disease No ?Autoimmune disease (antiphospholipid syndrome, systemic lupus erythematosus) No ? ?OR Two or more of the following: ?Nulliparity Yes ?Obesity (BMI>30 kg/m2) No ?Family h/o preeclampsia in mother or sister No ?Age ?35 years No ?Sociodemographic characteristics (African American race, low socioeconomic level) Yes ?Personal risk factors (eg, previous pregnancy w/ LBW or SGA, previous adverse pregnancy outcome [eg, stillbirth], interval >10 years between pregnancies) No ? ?Indications for early A1C (per uptodate) ?BMI >=25 (>=23 in Asian women) AND one of the following ?GDM in a previous pregnancy No ?Previous A1C?5.7, impaired glucose tolerance, or impaired fasting glucose on previous testing No ?First-degree relative with diabetes No ?High-risk race/ethnicity (eg, African American, Latino, Native American, [place] American, [place]) Yes ?History of cardiovascular disease No ?HTN or on therapy for hypertension No ?HDL cholesterol level <35 mg/dL (0.90 mmol/L) and/or a triglyceride level >250 mg/dL (2.82 mmol/L) No ?PCOS No ?Physical inactivity No ?Other clinical condition associated with insulin resistance (eg, severe obesity, acanthosis nigricans) No ?Previous birth of an infant weighing ?4000 g No ?Previous stillbirth of unknown cause No ?>= 40yo No ? ?Follow-up: Return in about 4 weeks (around 10/30/2021) for [place] visit.  ? ?Orders Placed This Encounter  ?Procedures  ? Urine Culture  ? Integrated 1  ? Genetic Screening  ? CBC/D/Plt+RPR+Rh+ABO+RubIgG...  ? POC Urinalysis  Dipstick OB  ? ? ?Janyth Pupa, DO ?Attending [place], Faculty Practice ?[facility] ? ? ?

## 2021-10-02 NOTE — Progress Notes (Signed)
[place] 12+6 wks,measurements c/w dates,CRL 64.47 mm,FHR 164 bpm,posterior placenta,normal ovaries,NB present,NT 1.5 mm ?Marland Kitchen ?

## 2021-10-03 ENCOUNTER — Encounter: Payer: Self-pay | Admitting: Obstetrics & Gynecology

## 2021-10-04 LAB — CYTOLOGY - PAP
Chlamydia: NEGATIVE
Comment: NEGATIVE
Comment: NORMAL
Diagnosis: NEGATIVE
Neisseria Gonorrhea: NEGATIVE

## 2021-10-04 LAB — URINE CULTURE

## 2021-10-06 ENCOUNTER — Other Ambulatory Visit: Payer: Self-pay | Admitting: Obstetrics & Gynecology

## 2021-10-06 DIAGNOSIS — B379 Candidiasis, unspecified: Secondary | ICD-10-CM

## 2021-10-06 MED ORDER — TERCONAZOLE 0.4 % VA CREA: 1.0000 | TOPICAL_CREAM | Freq: Every day | VAGINAL | 0 refills | Status: AC

## 2021-10-06 NOTE — Progress Notes (Signed)
Terazol for yeast infection ?

## 2021-10-10 LAB — INTEGRATED 1
Crown Rump Length: 64.5 mm
Gest. Age on Collection Date: 13.4 weeks
Maternal Age at EDD: 23.1 yr
Nuchal Translucency (NT): 1.5 mm
Number of Fetuses: 1
PAPP-A Value: 2167 ng/mL
Weight: 121 [lb_av]

## 2021-10-10 LAB — CBC/D/PLT+RPR+RH+ABO+RUBIGG...
Antibody Screen: NEGATIVE
Basophils Absolute: 0 10*3/uL (ref 0.0–0.2)
Basos: 0 %
EOS (ABSOLUTE): 0 10*3/uL (ref 0.0–0.4)
Eos: 1 %
HCV Ab: NONREACTIVE
HIV Screen 4th Generation wRfx: NONREACTIVE
Hematocrit: 33.6 % — ABNORMAL LOW (ref 34.0–46.6)
Hemoglobin: 11.6 g/dL (ref 11.1–15.9)
Hepatitis B Surface Ag: NEGATIVE
Immature Grans (Abs): 0 10*3/uL (ref 0.0–0.1)
Immature Granulocytes: 0 %
Lymphocytes Absolute: 1.8 10*3/uL (ref 0.7–3.1)
Lymphs: 23 %
MCH: 30.9 pg (ref 26.6–33.0)
MCHC: 34.5 g/dL (ref 31.5–35.7)
MCV: 90 fL (ref 79–97)
Monocytes Absolute: 0.4 10*3/uL (ref 0.1–0.9)
Monocytes: 5 %
Neutrophils Absolute: 5.4 10*3/uL (ref 1.4–7.0)
Neutrophils: 71 %
Platelets: 179 10*3/uL (ref 150–450)
RBC: 3.75 x10E6/uL — ABNORMAL LOW (ref 3.77–5.28)
RDW: 13.3 % (ref 11.7–15.4)
RPR Ser Ql: NONREACTIVE
Rh Factor: POSITIVE
Rubella Antibodies, IGG: 4.2 index (ref >0.99)
WBC: 7.6 10*3/uL (ref 3.4–10.8)

## 2021-10-10 LAB — HCV INTERPRETATION

## 2021-10-30 ENCOUNTER — Encounter: Payer: Self-pay | Admitting: Advanced Practice Midwife

## 2021-10-30 ENCOUNTER — Ambulatory Visit (INDEPENDENT_AMBULATORY_CARE_PROVIDER_SITE_OTHER): Payer: Medicaid Other | Admitting: Advanced Practice Midwife

## 2021-10-30 VITALS — BP 106/72 | HR 75 | Wt 125.0 lb

## 2021-10-30 DIAGNOSIS — Z3402 Encounter for supervision of normal first pregnancy, second trimester: Secondary | ICD-10-CM

## 2021-10-30 DIAGNOSIS — Z363 Encounter for antenatal screening for malformations: Secondary | ICD-10-CM

## 2021-10-30 DIAGNOSIS — Z1379 Encounter for other screening for genetic and chromosomal anomalies: Secondary | ICD-10-CM

## 2021-10-30 DIAGNOSIS — Z3A16 16 weeks gestation of pregnancy: Secondary | ICD-10-CM

## 2021-10-30 NOTE — Progress Notes (Signed)
   LOW-RISK PREGNANCY VISIT Patient name: Cindy Randall MRN [id number]  Date of birth: [birth year] Chief Complaint:   Routine Prenatal Visit (Rash at neck; bumps on arms and legs)  History of Present Illness:   Cindy Randall is a 21 y.o. G1P0 female at [redacted]w[redacted]d with an Estimated Date of Delivery: 04/10/22 being seen today for ongoing management of a low-risk pregnancy.  Today she reports no complaints. Contractions: Not present. Vag. Bleeding: None.  Movement: Present. denies leaking of fluid. Review of Systems:   Pertinent items are noted in HPI Denies abnormal vaginal discharge w/ itching/odor/irritation, headaches, visual changes, shortness of breath, chest pain, abdominal pain, severe nausea/vomiting, or problems with urination or bowel movements unless otherwise stated above. Pertinent History Reviewed:  Reviewed past medical,surgical, social, obstetrical and family history.  Reviewed problem list, medications and allergies. Physical Assessment:   Vitals:   10/30/21 0851  BP: 106/72  Pulse: 75  Weight: 125 lb (56.7 kg)  Body mass index is 22.86 kg/m.        Physical Examination:   General appearance: Well appearing, and in no distress  Mental status: Alert, oriented to person, place, and time  Skin: Warm & dry  Cardiovascular: Normal heart rate noted  Respiratory: Normal respiratory effort, no distress  Abdomen: Soft, gravid, nontender  Pelvic: Cervical exam deferred         Extremities: Edema: None  Fetal Status: Fetal Heart Rate (bpm): 154   Movement: Present    No results found for this or any previous visit (from the past 24 hour(s)).  Assessment & Plan:  1) Low-risk pregnancy G1P0 at [redacted]w[redacted]d with an Estimated Date of Delivery: 04/10/22     Meds: No orders of the defined types were placed in this encounter.  Labs/procedures today: 2nd IT  Plan:  Continue routine obstetrical care   Reviewed: Preterm labor symptoms and general obstetric precautions including but  not limited to vaginal bleeding, contractions, leaking of fluid and fetal movement were reviewed in detail with the patient.  All questions were answered. Has home bp cuff.  Check bp weekly, let us know if >140/90.   Follow-up: Return in about 3 weeks (around 11/20/2021) for [place], [place]: Anatomy, in person.  Orders Placed This Encounter  Procedures   US OB Comp + 14 Wk   [facility] [place] 10/30/2021 9:06 AM

## 2021-10-30 NOTE — Patient Instructions (Signed)
Cindy Randall, thank you for choosing our office today! We appreciate the opportunity to meet your healthcare needs. You may receive a short survey by mail, e-mail, or through MyChart. If you are happy with your care we would appreciate if you could take just a few minutes to complete the survey questions. We read all of your comments and take your feedback very seriously. Thank you again for choosing our office.  [facility] Team at [facility] ([address] [place], [place] [address]) Entrance C, located off of [address] Free 24/7 valet parking  Go to [email or web address] to register for FREE online childbirth classes  Call the office ([phone]) or go to [facility] if: You begin to severe cramping Your water breaks.  Sometimes it is a big gush of fluid, sometimes it is just a trickle that keeps getting your panties wet or running down your legs You have vaginal bleeding.  It is normal to have a small amount of spotting if your cervix was checked.   [place] [facility] ([facility]): [address], [phone]           [facility]: [address], [phone]                [facility] ([facility]): [address], [phone] (call to ask if accepting patients) [facility]: [address], [place], [phone]    [place] [facility]/Family Doctors [facility] ([facility]): [address], [phone] [facility]: [address], [phone] [facility]: [address], [phone]  [place] [facility]  [facility] ([facility]): [phone] [facility]: [address], [phone]   [place] [facility]: [address], [phone]  [place] [facility]  [facility]: [address], [phone]  Home Blood Pressure Monitoring for Patients   Your provider has recommended that you check your blood pressure (BP) at least once a week at home. If you do not have a blood pressure cuff at home, one will be provided for you. Contact your provider if you have not received your monitor within 1 week.   Helpful Tips for Accurate Home Blood Pressure Checks  Don't smoke, exercise, or drink caffeine 30 minutes before checking your BP Use the restroom before checking your BP (a full bladder can raise your pressure) Relax in a comfortable upright chair Feet on the ground Left arm resting comfortably on a flat surface at the level of your heart Legs uncrossed Back supported Sit quietly and don't talk Place the cuff on your bare arm Adjust snuggly, so that only two fingertips can fit between your skin and the top of the cuff Check 2 readings separated by at least one minute Keep a log of your BP readings For a visual, please reference this diagram: http://[facility].care/bpdiagram  Provider Name: [facility]     Phone: [phone]  Zone 1: ALL CLEAR  Continue to monitor your symptoms:  BP reading is less than 140 (top number) or less than 90 (bottom number)  No right upper stomach pain No headaches or seeing spots No feeling nauseated or throwing up No swelling in face and hands  Zone 2: CAUTION Call your doctor's office for any of the following:  BP reading is greater than 140 (top number) or greater than   90 (bottom number)  Stomach pain under your ribs in the middle or right side Headaches or seeing spots Feeling nauseated or throwing up Swelling in face and hands  Zone 3: EMERGENCY  Seek immediate medical care if you have any of the following:  BP reading is greater than160 (top number) or greater than 110 (bottom number) Severe headaches not improving with Tylenol Serious difficulty catching your breath Any worsening symptoms from  Zone 2     Second Trimester of Pregnancy The second trimester is from week 14 through week 27 (months 4 through 6). The second trimester is often a time when you feel your best. Your body has adjusted to being pregnant, and you begin to feel better physically. Usually, morning sickness has lessened or quit completely, you may have more energy, and you may have an increase in appetite. The second trimester is also a time when the fetus is growing rapidly. At the end of the sixth month, the fetus is about 9 inches long and weighs about 1 pounds. You will likely begin to feel the baby move (quickening) between 16 and 20 weeks of pregnancy. Body changes during your second trimester Your body continues to go through many changes during your second trimester. The changes vary from woman to woman. Your weight will continue to increase. You will notice your lower abdomen bulging out. You may begin to get stretch marks on your hips, abdomen, and breasts. You may develop headaches that can be relieved by medicines. The medicines should be approved by your health care provider. You may urinate more often because the fetus is pressing on your bladder. You may develop or continue to have heartburn as a result of your pregnancy. You may develop constipation because certain hormones are causing the muscles that push waste through your intestines to slow down. You may develop hemorrhoids or swollen, bulging veins (varicose veins). You may have back pain. This is caused by: Weight gain. Pregnancy hormones that are relaxing the joints in your pelvis. A shift in weight and the muscles that support your balance. Your breasts will continue to grow and they will continue to become tender. Your gums may bleed and may be sensitive to brushing and flossing. Dark spots or blotches (chloasma, mask of pregnancy) may develop on your face. This will likely fade after the baby is born. A dark line from your belly button to  the pubic area (linea nigra) may appear. This will likely fade after the baby is born. You may have changes in your hair. These can include thickening of your hair, rapid growth, and changes in texture. Some women also have hair loss during or after pregnancy, or hair that feels dry or thin. Your hair will most likely return to normal after your baby is born.  What to expect at prenatal visits During a routine prenatal visit: You will be weighed to make sure you and the fetus are growing normally. Your blood pressure will be taken. Your abdomen will be measured to track your baby's growth. The fetal heartbeat will be listened to. Any test results from the previous visit will be discussed.  Your health care provider may ask you: How you are feeling. If you are feeling the baby move. If you have had any abnormal symptoms, such as leaking fluid, bleeding, severe headaches, or abdominal cramping. If you are using any tobacco products, including cigarettes, chewing tobacco, and electronic cigarettes. If you have any questions.  Other tests that may be performed during   your second trimester include: Blood tests that check for: Low iron levels (anemia). High blood sugar that affects pregnant women (gestational diabetes) between 24 and 28 weeks. Rh antibodies. This is to check for a protein on red blood cells (Rh factor). Urine tests to check for infections, diabetes, or protein in the urine. An ultrasound to confirm the proper growth and development of the baby. An amniocentesis to check for possible genetic problems. Fetal screens for spina bifida and Down syndrome. HIV (human immunodeficiency virus) testing. Routine prenatal testing includes screening for HIV, unless you choose not to have this test.  Follow these instructions at home: Medicines Follow your health care provider's instructions regarding medicine use. Specific medicines may be either safe or unsafe to take during  pregnancy. Take a prenatal vitamin that contains at least 600 micrograms (mcg) of folic acid. If you develop constipation, try taking a stool softener if your health care provider approves. Eating and drinking Eat a balanced diet that includes fresh fruits and vegetables, whole grains, good sources of protein such as meat, eggs, or tofu, and low-fat dairy. Your health care provider will help you determine the amount of weight gain that is right for you. Avoid raw meat and uncooked cheese. These carry germs that can cause birth defects in the baby. If you have low calcium intake from food, talk to your health care provider about whether you should take a daily calcium supplement. Limit foods that are high in fat and processed sugars, such as fried and sweet foods. To prevent constipation: Drink enough fluid to keep your urine clear or pale yellow. Eat foods that are high in fiber, such as fresh fruits and vegetables, whole grains, and beans. Activity Exercise only as directed by your health care provider. Most women can continue their usual exercise routine during pregnancy. Try to exercise for 30 minutes at least 5 days a week. Stop exercising if you experience uterine contractions. Avoid heavy lifting, wear low heel shoes, and practice good posture. A sexual relationship may be continued unless your health care provider directs you otherwise. Relieving pain and discomfort Wear a good support bra to prevent discomfort from breast tenderness. Take warm sitz baths to soothe any pain or discomfort caused by hemorrhoids. Use hemorrhoid cream if your health care provider approves. Rest with your legs elevated if you have leg cramps or low back pain. If you develop varicose veins, wear support hose. Elevate your feet for 15 minutes, 3-4 times a day. Limit salt in your diet. Prenatal Care Write down your questions. Take them to your prenatal visits. Keep all your prenatal visits as told by your health  care provider. This is important. Safety Wear your seat belt at all times when driving. Make a list of emergency phone numbers, including numbers for family, friends, the hospital, and police and fire departments. General instructions Ask your health care provider for a referral to a local prenatal education class. Begin classes no later than the beginning of month 6 of your pregnancy. Ask for help if you have counseling or nutritional needs during pregnancy. Your health care provider can offer advice or refer you to specialists for help with various needs. Do not use hot tubs, steam rooms, or saunas. Do not douche or use tampons or scented sanitary pads. Do not cross your legs for long periods of time. Avoid cat litter boxes and soil used by cats. These carry germs that can cause birth defects in the baby and possibly loss of the   fetus by miscarriage or stillbirth. Avoid all smoking, herbs, alcohol, and unprescribed drugs. Chemicals in these products can affect the formation and growth of the baby. Do not use any products that contain nicotine or tobacco, such as cigarettes and e-cigarettes. If you need help quitting, ask your health care provider. Visit your dentist if you have not gone yet during your pregnancy. Use a soft toothbrush to brush your teeth and be gentle when you floss. Contact a health care provider if: You have dizziness. You have mild pelvic cramps, pelvic pressure, or nagging pain in the abdominal area. You have persistent nausea, vomiting, or diarrhea. You have a bad smelling vaginal discharge. You have pain when you urinate. Get help right away if: You have a fever. You are leaking fluid from your vagina. You have spotting or bleeding from your vagina. You have severe abdominal cramping or pain. You have rapid weight gain or weight loss. You have shortness of breath with chest pain. You notice sudden or extreme swelling of your face, hands, ankles, feet, or legs. You  have not felt your baby move in over an hour. You have severe headaches that do not go away when you take medicine. You have vision changes. Summary The second trimester is from week 14 through week 27 (months 4 through 6). It is also a time when the fetus is growing rapidly. Your body goes through many changes during pregnancy. The changes vary from woman to woman. Avoid all smoking, herbs, alcohol, and unprescribed drugs. These chemicals affect the formation and growth your baby. Do not use any tobacco products, such as cigarettes, chewing tobacco, and e-cigarettes. If you need help quitting, ask your health care provider. Contact your health care provider if you have any questions. Keep all prenatal visits as told by your health care provider. This is important. This information is not intended to replace advice given to you by your health care provider. Make sure you discuss any questions you have with your health care provider. Document Released: 05/13/2001 Document Revised: 10/25/2015 Document Reviewed: 07/20/2012 Elsevier Interactive Patient Education  2017 [employer]  

## 2021-11-01 LAB — INTEGRATED 2
AFP MoM: 0.71
Alpha-Fetoprotein: 30.4 ng/mL
Crown Rump Length: 64.5 mm
DIA MoM: 0.76
DIA Value: 130.3 pg/mL
Estriol, Unconjugated: 1.04 ng/mL
Gest. Age on Collection Date: 13.4 weeks
Gestational Age: 16.6 weeks
Maternal Age at EDD: 23.1 yr
Nuchal Translucency (NT): 1.5 mm
Nuchal Translucency MoM: 1.04
Number of Fetuses: 1
PAPP-A MoM: 1.2
PAPP-A Value: 2167 ng/mL
Test Results:: NEGATIVE
Weight: 121 [lb_av]
Weight: 125 [lb_av]
hCG MoM: 0.91
hCG Value: 33.2 IU/mL
uE3 MoM: 0.96

## 2021-11-13 ENCOUNTER — Encounter: Payer: Self-pay | Admitting: Women's Health

## 2021-12-02 ENCOUNTER — Encounter: Payer: Medicaid Other | Admitting: Women's Health

## 2021-12-02 ENCOUNTER — Ambulatory Visit (INDEPENDENT_AMBULATORY_CARE_PROVIDER_SITE_OTHER): Payer: Medicaid Other | Admitting: Women's Health

## 2021-12-02 ENCOUNTER — Other Ambulatory Visit: Payer: Medicaid Other

## 2021-12-02 ENCOUNTER — Encounter: Payer: Self-pay | Admitting: Women's Health

## 2021-12-02 VITALS — BP 117/75 | HR 91 | Wt 130.2 lb

## 2021-12-02 DIAGNOSIS — Z3402 Encounter for supervision of normal first pregnancy, second trimester: Secondary | ICD-10-CM

## 2021-12-02 NOTE — Progress Notes (Signed)
    LOW-RISK PREGNANCY VISIT Patient name: Cindy Randall MRN [id number]  Date of birth: Oct 09, 2000 Chief Complaint:   Routine Prenatal Visit  History of Present Illness:   Cindy Randall is a 21 y.o. G1P0 female at [redacted]w[redacted]d with an Estimated Date of Delivery: 04/10/22 being seen today for ongoing management of a low-risk pregnancy.   Today she reports no complaints. Contractions: Not present. Vag. Bleeding: None.  Movement: Present. denies leaking of fluid.     10/02/2021   11:19 AM  Depression screen PHQ 2/9  Decreased Interest 0  Down, Depressed, Hopeless 1  PHQ - 2 Score 1  Altered sleeping 1  Tired, decreased energy 0  Change in appetite 0  Feeling bad or failure about yourself  0  Trouble concentrating 0  Moving slowly or fidgety/restless 0  Suicidal thoughts 0  PHQ-9 Score 2        10/02/2021   11:19 AM  GAD 7 : Generalized Anxiety Score  Nervous, Anxious, on Edge 0  Control/stop worrying 1  Worry too much - different things 1  Trouble relaxing 0  Restless 0  Easily annoyed or irritable 2  Afraid - awful might happen 1  Total GAD 7 Score 5      Review of Systems:   Pertinent items are noted in HPI Denies abnormal vaginal discharge w/ itching/odor/irritation, headaches, visual changes, shortness of breath, chest pain, abdominal pain, severe nausea/vomiting, or problems with urination or bowel movements unless otherwise stated above. Pertinent History Reviewed:  Reviewed past medical,surgical, social, obstetrical and family history.  Reviewed problem list, medications and allergies. Physical Assessment:   Vitals:   12/02/21 1035  BP: 117/75  Pulse: 91  Weight: 130 lb 3.2 oz (59.1 kg)  Body mass index is 23.81 kg/m.        Physical Examination:   General appearance: Well appearing, and in no distress  Mental status: Alert, oriented to person, place, and time  Skin: Warm & dry  Cardiovascular: Normal heart rate noted  Respiratory: Normal respiratory  effort, no distress  Abdomen: Soft, gravid, nontender  Pelvic: Cervical exam deferred         Extremities: Edema: None  Fetal Status: Fetal Heart Rate (bpm): 152 Fundal Height: 20 cm Movement: Present    Chaperone: N/A   No results found for this or any previous visit (from the past 24 hour(s)).  Assessment & Plan:  1) Low-risk pregnancy G1P0 at [redacted]w[redacted]d with an Estimated Date of Delivery: 04/10/22    Meds: No orders of the defined types were placed in this encounter.  Labs/procedures today: none, was late for u/s, so rescheduled  Plan:  Continue routine obstetrical care  Next visit: prefers will be in person for u/s     Reviewed: Preterm labor symptoms and general obstetric precautions including but not limited to vaginal bleeding, contractions, leaking of fluid and fetal movement were reviewed in detail with the patient.  All questions were answered. Does have home bp cuff. Office bp cuff given: not applicable. Check bp weekly, let us know if consistently >140 and/or >90.  Follow-up: Return for As scheduled for u/s, add LROB w/ CNM.  Future Appointments  Date Time Provider Department Center  12/02/2021 11:10 AM Cheral Marker, CNM [facility]  12/19/2021  3:30 PM [facility] [place] [facility] None    No orders of the defined types were placed in this encounter.  Cheral Marker CNM, [facility] 12/02/2021 11:04 AM

## 2021-12-02 NOTE — Patient Instructions (Signed)
Cindy Randall, thank you for choosing our office today! We appreciate the opportunity to meet your healthcare needs. You may receive a short survey by mail, e-mail, or through [employer]. If you are happy with your care we would appreciate if you could take just a few minutes to complete the survey questions. We read all of your comments and take your feedback very seriously. Thank you again for choosing our office.  Center for [employer] Team at [facility] at [facility] ([address] [place], [place] [address]) Entrance C, located off of [address] Free 24/7 valet parking  Go to [email or web address] to register for FREE online childbirth classes  Call the office [phone]) or go to [facility] if: You begin to severe cramping Your water breaks.  Sometimes it is a big gush of fluid, sometimes it is just a trickle that keeps getting your panties wet or running down your legs You have vaginal bleeding.  It is normal to have a small amount of spotting if your cervix was checked.   [facility] Pediatricians/Family Doctors [facility]): [address] Colette Ribas, [phone]           [facility]: [address], [phone]                [facility] Medicine [facility]): [address], [phone] (call to ask if accepting patients) [facility] Department: [address], [place], [phone]    [facility] Pediatricians/Family Doctors [facility]): [phone], [phone] [facility]: [address] [place], [phone] [facility]: [address], [phone]  [facility] Doctors  [facility]): [phone] [facility]: [address], [phone]   [facility] Doctors [facility]: [address] [phone]  [facility] Doctors  [facility]: [phone], [phone]  Home Blood Pressure Monitoring for Patients   Your provider has recommended that you check your blood pressure (BP) at least once a week at home. If you do not have a blood pressure cuff at home, one will be provided for you. Contact your provider if you have not received your monitor within 1 week.   Helpful Tips for Accurate Home Blood Pressure Checks  Don't smoke, exercise, or drink caffeine 30 minutes before checking your BP Use the restroom before checking your BP (a full bladder can raise your pressure) Relax in a comfortable upright chair Feet on the ground Left arm resting comfortably on a flat surface at the level of your heart Legs uncrossed Back supported Sit quietly and don't talk Place the cuff on your bare arm Adjust snuggly, so that only two fingertips can fit between your skin and the top of the cuff Check 2 readings separated by at least one minute Keep a log of your BP readings For a visual, please reference this diagram: http://ccnc.care/bpdiagram  Provider Name: [facility]     Phone: [phone]  Zone 1: ALL CLEAR  Continue to monitor your symptoms:  BP reading is less than 140 (top number) or less than 90 (bottom number)  No right upper stomach pain No headaches or seeing spots No feeling nauseated or throwing up No swelling in face and hands  Zone 2: CAUTION Call your doctor's office for any of the following:  BP reading is greater than 140 (top number) or greater than  90 (bottom number)  Stomach pain under your ribs in the middle or right side Headaches or seeing spots Feeling nauseated or throwing up Swelling in face and hands  Zone 3: EMERGENCY  Seek immediate medical care if you have any of the following:  BP reading is greater than160 (top number) or greater than 110 (bottom number) Severe headaches not improving with Tylenol Serious difficulty catching your breath Any worsening symptoms from  Zone 2     Second Trimester of Pregnancy The second trimester is from week 14 through week 27 (months 4 through 6). The second trimester is often a time when you feel your best. Your body has adjusted to being pregnant, and you begin to feel better physically. Usually, morning sickness has lessened or quit completely, you may have more energy, and you may have an increase in appetite. The second trimester is also a time when the fetus is growing rapidly. At the end of the sixth month, the fetus is about 9 inches long and weighs about 1 pounds. You will likely begin to feel the baby move (quickening) between 16 and 20 weeks of pregnancy. Body changes during your second trimester Your body continues to go through many changes during your second trimester. The changes vary from woman to woman. Your weight will continue to increase. You will notice your lower abdomen bulging out. You may begin to get stretch marks on your hips, abdomen, and breasts. You may develop headaches that can be relieved by medicines. The medicines should be approved by your health care provider. You may urinate more often because the fetus is pressing on your bladder. You may develop or continue to have heartburn as a result of your pregnancy. You may develop constipation because certain hormones are causing the muscles that push waste through your intestines to slow down. You may develop hemorrhoids or swollen, bulging veins (varicose veins). You may have back pain. This is caused by: Weight gain. Pregnancy hormones that are relaxing the joints in your pelvis. A shift in weight and the muscles that support your balance. Your breasts will continue to grow and they will continue to become tender. Your gums may bleed and may be sensitive to brushing and flossing. Dark spots or blotches (chloasma, mask of pregnancy) may develop on your face. This will likely fade after the baby is born. A dark line from your belly button to  the pubic area (linea nigra) may appear. This will likely fade after the baby is born. You may have changes in your hair. These can include thickening of your hair, rapid growth, and changes in texture. Some women also have hair loss during or after pregnancy, or hair that feels dry or thin. Your hair will most likely return to normal after your baby is born.  What to expect at prenatal visits During a routine prenatal visit: You will be weighed to make sure you and the fetus are growing normally. Your blood pressure will be taken. Your abdomen will be measured to track your baby's growth. The fetal heartbeat will be listened to. Any test results from the previous visit will be discussed.  Your health care provider may ask you: How you are feeling. If you are feeling the baby move. If you have had any abnormal symptoms, such as leaking fluid, bleeding, severe headaches, or abdominal cramping. If you are using any tobacco products, including cigarettes, chewing tobacco, and electronic cigarettes. If you have any questions.  Other tests that may be performed during   your second trimester include: Blood tests that check for: Low iron levels (anemia). High blood sugar that affects pregnant women (gestational diabetes) between 24 and 28 weeks. Rh antibodies. This is to check for a protein on red blood cells (Rh factor). Urine tests to check for infections, diabetes, or protein in the urine. An ultrasound to confirm the proper growth and development of the baby. An amniocentesis to check for possible genetic problems. Fetal screens for spina bifida and Down syndrome. HIV (human immunodeficiency virus) testing. Routine prenatal testing includes screening for HIV, unless you choose not to have this test.  Follow these instructions at home: Medicines Follow your health care provider's instructions regarding medicine use. Specific medicines may be either safe or unsafe to take during  pregnancy. Take a prenatal vitamin that contains at least 600 micrograms (mcg) of folic acid. If you develop constipation, try taking a stool softener if your health care provider approves. Eating and drinking Eat a balanced diet that includes fresh fruits and vegetables, whole grains, good sources of protein such as meat, eggs, or tofu, and low-fat dairy. Your health care provider will help you determine the amount of weight gain that is right for you. Avoid raw meat and uncooked cheese. These carry germs that can cause birth defects in the baby. If you have low calcium intake from food, talk to your health care provider about whether you should take a daily calcium supplement. Limit foods that are high in fat and processed sugars, such as fried and sweet foods. To prevent constipation: Drink enough fluid to keep your urine clear or pale yellow. Eat foods that are high in fiber, such as fresh fruits and vegetables, whole grains, and beans. Activity Exercise only as directed by your health care provider. Most women can continue their usual exercise routine during pregnancy. Try to exercise for 30 minutes at least 5 days a week. Stop exercising if you experience uterine contractions. Avoid heavy lifting, wear low heel shoes, and practice good posture. A sexual relationship may be continued unless your health care provider directs you otherwise. Relieving pain and discomfort Wear a good support bra to prevent discomfort from breast tenderness. Take warm sitz baths to soothe any pain or discomfort caused by hemorrhoids. Use hemorrhoid cream if your health care provider approves. Rest with your legs elevated if you have leg cramps or low back pain. If you develop varicose veins, wear support hose. Elevate your feet for 15 minutes, 3-4 times a day. Limit salt in your diet. Prenatal Care Write down your questions. Take them to your prenatal visits. Keep all your prenatal visits as told by your health  care provider. This is important. Safety Wear your seat belt at all times when driving. Make a list of emergency phone numbers, including numbers for family, friends, the hospital, and police and fire departments. General instructions Ask your health care provider for a referral to a local prenatal education class. Begin classes no later than the beginning of month 6 of your pregnancy. Ask for help if you have counseling or nutritional needs during pregnancy. Your health care provider can offer advice or refer you to specialists for help with various needs. Do not use hot tubs, steam rooms, or saunas. Do not douche or use tampons or scented sanitary pads. Do not cross your legs for long periods of time. Avoid cat litter boxes and soil used by cats. These carry germs that can cause birth defects in the baby and possibly loss of the   fetus by miscarriage or stillbirth. Avoid all smoking, herbs, alcohol, and unprescribed drugs. Chemicals in these products can affect the formation and growth of the baby. Do not use any products that contain nicotine or tobacco, such as cigarettes and e-cigarettes. If you need help quitting, ask your health care provider. Visit your dentist if you have not gone yet during your pregnancy. Use a soft toothbrush to brush your teeth and be gentle when you floss. Contact a health care provider if: You have dizziness. You have mild pelvic cramps, pelvic pressure, or nagging pain in the abdominal area. You have persistent nausea, vomiting, or diarrhea. You have a bad smelling vaginal discharge. You have pain when you urinate. Get help right away if: You have a fever. You are leaking fluid from your vagina. You have spotting or bleeding from your vagina. You have severe abdominal cramping or pain. You have rapid weight gain or weight loss. You have shortness of breath with chest pain. You notice sudden or extreme swelling of your face, hands, ankles, feet, or legs. You  have not felt your baby move in over an hour. You have severe headaches that do not go away when you take medicine. You have vision changes. Summary The second trimester is from week 14 through week 27 (months 4 through 6). It is also a time when the fetus is growing rapidly. Your body goes through many changes during pregnancy. The changes vary from woman to woman. Avoid all smoking, herbs, alcohol, and unprescribed drugs. These chemicals affect the formation and growth your baby. Do not use any tobacco products, such as cigarettes, chewing tobacco, and e-cigarettes. If you need help quitting, ask your health care provider. Contact your health care provider if you have any questions. Keep all prenatal visits as told by your health care provider. This is important. This information is not intended to replace advice given to you by your health care provider. Make sure you discuss any questions you have with your health care provider. Document Released: 05/13/2001 Document Revised: 10/25/2015 Document Reviewed: 07/20/2012 Elsevier Interactive Patient Education  2017 [employer].

## 2021-12-19 ENCOUNTER — Ambulatory Visit (INDEPENDENT_AMBULATORY_CARE_PROVIDER_SITE_OTHER): Payer: Medicaid Other

## 2021-12-19 ENCOUNTER — Ambulatory Visit (INDEPENDENT_AMBULATORY_CARE_PROVIDER_SITE_OTHER): Payer: Medicaid Other | Admitting: Advanced Practice Midwife

## 2021-12-19 VITALS — BP 96/67 | HR 75 | Wt 133.0 lb

## 2021-12-19 DIAGNOSIS — Z363 Encounter for antenatal screening for malformations: Secondary | ICD-10-CM | POA: Diagnosis not present

## 2021-12-19 DIAGNOSIS — Z3A24 24 weeks gestation of pregnancy: Secondary | ICD-10-CM | POA: Diagnosis not present

## 2021-12-19 DIAGNOSIS — Z3402 Encounter for supervision of normal first pregnancy, second trimester: Secondary | ICD-10-CM

## 2021-12-19 NOTE — Progress Notes (Signed)
ROB 24 wks [place] today Reports itchy areas chest and back, intermittently, denies itchy palms or soles of feet.

## 2021-12-19 NOTE — Progress Notes (Signed)
[place] 24 wks,cephalic,posterior placenta gr 0,normal ovaries,cx 3.1 cm,SVP of fluid 7.5 cm,LVEICF,FHR 146 bpm,EFW 661 g 46%,anatomy complete

## 2021-12-19 NOTE — Patient Instructions (Signed)

## 2021-12-19 NOTE — Progress Notes (Signed)
   LOW-RISK PREGNANCY VISIT Patient name: Cindy Randall MRN [id number]  Date of birth: 2000-11-23 Chief Complaint:   Routine Prenatal Visit  History of Present Illness:   Cindy Randall is a 21 y.o. G1P0 female at [redacted]w[redacted]d with an Estimated Date of Delivery: 04/10/22 being seen today for ongoing management of a low-risk pregnancy.  Today she reports no complaints. Contractions: Not present. Vag. Bleeding: None.  Movement: Present. denies leaking of fluid. Review of Systems:   Pertinent items are noted in HPI Denies abnormal vaginal discharge w/ itching/odor/irritation, headaches, visual changes, shortness of breath, chest pain, abdominal pain, severe nausea/vomiting, or problems with urination or bowel movements unless otherwise stated above. Pertinent History Reviewed:  Reviewed past medical,surgical, social, obstetrical and family history.  Reviewed problem list, medications and allergies. Physical Assessment:   Vitals:   12/19/21 1615  BP: 96/67  Pulse: 75  Weight: 133 lb (60.3 kg)  Body mass index is 24.33 kg/m.        Physical Examination:   General appearance: Well appearing, and in no distress  Mental status: Alert, oriented to person, place, and time  Skin: Warm & dry  Cardiovascular: Normal heart rate noted  Respiratory: Normal respiratory effort, no distress  Abdomen: Soft, gravid, nontender  Pelvic: Cervical exam deferred         Extremities: Edema: None  Fetal Status:     Movement: Present  [place] 24 wks,cephalic,posterior placenta gr 0,normal ovaries,cx 3.1 cm,SVP of fluid 7.5 cm,LVEICF,FHR 146 bpm,EFW 661 g 46%,anatomy complete   Chaperone: n/a    No results found for this or any previous visit (from the past 24 hour(s)).  Assessment & Plan:  1) Low-risk pregnancy G1P0 at [redacted]w[redacted]d with an Estimated Date of Delivery: 04/10/22     Meds: No orders of the defined types were placed in this encounter.  Labs/procedures today: anatomy scan  Plan:  Continue routine  obstetrical care  Next visit: prefers will be in person for PN2     Reviewed: Preterm labor symptoms and general obstetric precautions including but not limited to vaginal bleeding, contractions, leaking of fluid and fetal movement were reviewed in detail with the patient.  All questions were answered. Has home bp cuff. Check bp weekly, let us know if >140/90.   Follow-up: No follow-ups on file.  No orders of the defined types were placed in this encounter.  Jacklyn Shell DNP, CNM 12/19/2021 4:56 PM

## 2022-01-16 ENCOUNTER — Other Ambulatory Visit: Payer: Medicaid Other

## 2022-01-16 ENCOUNTER — Ambulatory Visit (INDEPENDENT_AMBULATORY_CARE_PROVIDER_SITE_OTHER): Payer: Medicaid Other | Admitting: Advanced Practice Midwife

## 2022-01-16 ENCOUNTER — Encounter: Payer: Self-pay | Admitting: Advanced Practice Midwife

## 2022-01-16 VITALS — BP 120/79 | HR 97 | Wt 138.5 lb

## 2022-01-16 DIAGNOSIS — Z3403 Encounter for supervision of normal first pregnancy, third trimester: Secondary | ICD-10-CM

## 2022-01-16 DIAGNOSIS — Z3402 Encounter for supervision of normal first pregnancy, second trimester: Secondary | ICD-10-CM

## 2022-01-16 DIAGNOSIS — Z131 Encounter for screening for diabetes mellitus: Secondary | ICD-10-CM

## 2022-01-16 DIAGNOSIS — Z3A27 27 weeks gestation of pregnancy: Secondary | ICD-10-CM

## 2022-01-16 DIAGNOSIS — Z3A28 28 weeks gestation of pregnancy: Secondary | ICD-10-CM

## 2022-01-16 NOTE — Patient Instructions (Addendum)
Cindy [facility], I greatly value your feedback.  If you receive a survey following your visit with [place] today, we appreciate you taking the time to fill it out.  Thanks, C[facility], CNM  Vagal Maneuver Techniques  There are different types of vagal maneuvers that can be used to slow a person's heart rate. There is not one maneuver which will work for all patients. In some instances, it may take a little trial and error to determine what technique works best for an individual patient. All of the procedures or techniques below may stimulate the vagal nerve.  Bearing Down: Bearing down, which medically is referred to as the Valsalva maneuver, is one of the most common ways to stimulate the vagus nerve. The patient is instructed to bear down as if they were having a bowel movement. In effect, the patient is expiring against a closed glottis. An alternative way to perform a Valsalva maneuver is to tell the patient to blow through an occluded straw for several seconds (Hold one end closed and blow through the other). These maneuvers increase intrathoracic pressure and stimulate the vagus nerve.  Coughing: Coughing creates the same physiological response as bearing down, but some people may find it easier to perform. The cough must be forceful and sustained i.e. a single cough will likely not be effective in terminating an arrhythmia  Cold Stimulus to the Face: This technique involves emerging the face in ice cold water. Alternative methods include placing on icepack on the face or a washcloth soaked in ice water. The cold stimuli to the face should last about 10 seconds. This type of vagal maneuver creates a physiological response similar to that which occurs if a person is submerged in cold water (diver's reflex).  Gagging: Although it may not sound pleasant, gagging also stimulates the vagus nerve and can stop an episode of SVT.  A tongue depressor is briefly inserted into the mouth, touching  the back of the throat, which causes the person to reflexively gag. The gag reflex stimulates the vagus nerve.     [facility]!!! It is now [facility] ([address] [place], [place] [address]) Entrance located off of [address] Free 24/7 valet parking   Go to [email or web address] to register for FREE online childbirth classes    Call the office [phone]) or go to [facility] if: You begin to have strong, frequent contractions Your water breaks.  Sometimes it is a big gush of fluid, sometimes it is just a trickle that keeps getting your panties wet or running down your legs You have vaginal bleeding.  It is normal to have a small amount of spotting if your cervix was checked.  You don't feel your baby moving like normal.  If you don't, get you something to eat and drink and lay down and focus on feeling your baby move.  You should feel at least 10 movements in 2 hours.  If you don't, you should call the office or go to [facility].    Tdap Vaccine It is recommended that you get the Tdap vaccine during the third trimester of EACH pregnancy to help protect your baby from getting pertussis (whooping cough) 27-36 weeks is the BEST time to do this so that you can pass the protection on to your baby. During pregnancy is better than after pregnancy, but if you are unable to get it during pregnancy it will be offered at the [facility].  You  will be offered this vaccine in the office after 27 weeks. If you do not have health insurance, you can get this vaccine at the health department or your family doctor Everyone who will be around your baby should also be up-to-date on their vaccines. Adults (who are not pregnant) only need 1 dose of Tdap during adulthood.   Third Trimester of Pregnancy The third trimester is from week 29 through week 42, months 7 through 9. The third trimester is a time when the fetus is growing rapidly. At the end of the ninth month, the  fetus is about 20 inches in length and weighs 6-10 pounds.  BODY CHANGES Your body goes through many changes during pregnancy. The changes vary from woman to woman.  Your weight will continue to increase. You can expect to gain 25-35 pounds (11-16 kg) by the end of the pregnancy. You may begin to get stretch marks on your hips, abdomen, and breasts. You may urinate more often because the fetus is moving lower into your pelvis and pressing on your bladder. You may develop or continue to have heartburn as a result of your pregnancy. You may develop constipation because certain hormones are causing the muscles that push waste through your intestines to slow down. You may develop hemorrhoids or swollen, bulging veins (varicose veins). You may have pelvic pain because of the weight gain and pregnancy hormones relaxing your joints between the bones in your pelvis. Backaches may result from overexertion of the muscles supporting your posture. You may have changes in your hair. These can include thickening of your hair, rapid growth, and changes in texture. Some women also have hair loss during or after pregnancy, or hair that feels dry or thin. Your hair will most likely return to normal after your baby is born. Your breasts will continue to grow and be tender. A yellow discharge may leak from your breasts called colostrum. Your belly button may stick out. You may feel short of breath because of your expanding uterus. You may notice the fetus "dropping," or moving lower in your abdomen. You may have a bloody mucus discharge. This usually occurs a few days to a week before labor begins. Your cervix becomes thin and soft (effaced) near your due date. WHAT TO EXPECT AT YOUR PRENATAL EXAMS  You will have prenatal exams every 2 weeks until week 36. Then, you will have weekly prenatal exams. During a routine prenatal visit: You will be weighed to make sure you and the fetus are growing normally. Your blood  pressure is taken. Your abdomen will be measured to track your baby's growth. The fetal heartbeat will be listened to. Any test results from the previous visit will be discussed. You may have a cervical check near your due date to see if you have effaced. At around 36 weeks, your caregiver will check your cervix. At the same time, your caregiver will also perform a test on the secretions of the vaginal tissue. This test is to determine if a type of bacteria, Group B streptococcus, is present. Your caregiver will explain this further. Your caregiver may ask you: What your birth plan is. How you are feeling. If you are feeling the baby move. If you have had any abnormal symptoms, such as leaking fluid, bleeding, severe headaches, or abdominal cramping. If you have any questions. Other tests or screenings that may be performed during your third trimester include: Blood tests that check for low iron levels (anemia). Fetal testing to check the  health, activity level, and growth of the fetus. Testing is done if you have certain medical conditions or if there are problems during the pregnancy. FALSE LABOR You may feel small, irregular contractions that eventually go away. These are called Braxton Hicks contractions, or false labor. Contractions may last for hours, days, or even weeks before true labor sets in. If contractions come at regular intervals, intensify, or become painful, it is best to be seen by your caregiver.  SIGNS OF LABOR  Menstrual-like cramps. Contractions that are 5 minutes apart or less. Contractions that start on the top of the uterus and spread down to the lower abdomen and back. A sense of increased pelvic pressure or back pain. A watery or bloody mucus discharge that comes from the vagina. If you have any of these signs before the 37th week of pregnancy, call your caregiver right away. You need to go to the [facility] to get checked immediately. HOME CARE INSTRUCTIONS  Avoid  all smoking, herbs, alcohol, and unprescribed drugs. These chemicals affect the formation and growth of the baby. Follow your caregiver's instructions regarding medicine use. There are medicines that are either safe or unsafe to take during pregnancy. Exercise only as directed by your caregiver. Experiencing uterine cramps is a good sign to stop exercising. Continue to eat regular, healthy meals. Wear a good support bra for breast tenderness. Do not use hot tubs, steam rooms, or saunas. Wear your seat belt at all times when driving. Avoid raw meat, uncooked cheese, cat litter boxes, and soil used by cats. These carry germs that can cause birth defects in the baby. Take your prenatal vitamins. Try taking a stool softener (if your caregiver approves) if you develop constipation. Eat more high-fiber foods, such as fresh vegetables or fruit and whole grains. Drink plenty of fluids to keep your urine clear or pale yellow. Take warm sitz baths to soothe any pain or discomfort caused by hemorrhoids. Use hemorrhoid cream if your caregiver approves. If you develop varicose veins, wear support hose. Elevate your feet for 15 minutes, 3-4 times a day. Limit salt in your diet. Avoid heavy lifting, wear low heal shoes, and practice good posture. Rest a lot with your legs elevated if you have leg cramps or low back pain. Visit your dentist if you have not gone during your pregnancy. Use a soft toothbrush to brush your teeth and be gentle when you floss. A sexual relationship may be continued unless your caregiver directs you otherwise. Do not travel far distances unless it is absolutely necessary and only with the approval of your caregiver. Take prenatal classes to understand, practice, and ask questions about the labor and delivery. Make a trial run to the [facility]. Pack your [facility] bag. Prepare the baby's nursery. Continue to go to all your prenatal visits as directed by your caregiver. SEEK MEDICAL CARE  IF: You are unsure if you are in labor or if your water has broken. You have dizziness. You have mild pelvic cramps, pelvic pressure, or nagging pain in your abdominal area. You have persistent nausea, vomiting, or diarrhea. You have a bad smelling vaginal discharge. You have pain with urination. SEEK IMMEDIATE MEDICAL CARE IF:  You have a fever. You are leaking fluid from your vagina. You have spotting or bleeding from your vagina. You have severe abdominal cramping or pain. You have rapid weight loss or gain. You have shortness of breath with chest pain. You notice sudden or extreme swelling of your face, hands, ankles, feet,  or legs. You have not felt your baby move in over an hour. You have severe headaches that do not go away with medicine. You have vision changes. Document Released: 05/13/2001 Document Revised: 05/24/2013 Document Reviewed: 07/20/2012 [facility] Patient Information 2015 [place], [place]. This information is not intended to replace advice given to you by your health care provider. Make sure you discuss any questions you have with your health care provider.  Go to [email or web address] to register for FREE online childbirth classes    Call the office [phone]) or go to [facility] if: You begin to have strong, frequent contractions Your water breaks.  Sometimes it is a big gush of fluid, sometimes it is just a trickle that keeps getting your panties wet or running down your legs You have vaginal bleeding.  It is normal to have a small amount of spotting if your cervix was checked.  You don't feel your baby moving like normal.  If you don't, get you something to eat and drink and lay down and focus on feeling your baby move.  You should feel at least 10 movements in 2 hours.  If you don't, you should call the office or go to [employer] [facility] [place].   Home Blood Pressure Monitoring for Patients   Your provider has recommended that you check your blood pressure  (BP) at least once a week at home. If you do not have a blood pressure cuff at home, one will be provided for you. Contact your provider if you have not received your monitor within 1 week.   Helpful Tips for Accurate Home Blood Pressure Checks  Don't smoke, exercise, or drink caffeine 30 minutes before checking your BP Use the restroom before checking your BP (a full bladder can raise your pressure) Relax in a comfortable upright chair Feet on the ground Left arm resting comfortably on a flat surface at the level of your heart Legs uncrossed Back supported Sit quietly and don't talk Place the cuff on your bare arm Adjust snuggly, so that only two fingertips can fit between your skin and the top of the cuff Check 2 readings separated by at least one minute Keep a log of your BP readings For a visual, please reference this diagram: http://ccnc.care/bpdiagram  Provider Name: [facility]     Phone: [phone]  Zone 1: ALL CLEAR  Continue to monitor your symptoms:  BP reading is less than 140 (top number) or less than 90 (bottom number)  No right upper stomach pain No headaches or seeing spots No feeling nauseated or throwing up No swelling in face and hands  Zone 2: CAUTION Call your doctor's office for any of the following:  BP reading is greater than 140 (top number) or greater than 90 (bottom number)  Stomach pain under your ribs in the middle or right side Headaches or seeing spots Feeling nauseated or throwing up Swelling in face and hands  Zone 3: EMERGENCY  Seek immediate medical care if you have any of the following:  BP reading is greater than160 (top number) or greater than 110 (bottom number) Severe headaches not improving with Tylenol Serious difficulty catching your breath Any worsening symptoms from Zone 2

## 2022-01-16 NOTE — Progress Notes (Signed)
   LOW-RISK PREGNANCY VISIT Patient name: Cindy [facility] MRN [id number]  Date of birth: [birth year] Chief Complaint:   Routine Prenatal Visit (PN2)  History of Present Illness:   Cindy Randall is a 21 y.o. G1P0 female at [redacted]w[redacted]d with an Estimated Date of Delivery: 04/10/22 being seen today for ongoing management of a low-risk pregnancy.  Today she reports no complaints. Contractions: Not present. Vag. Bleeding: None.  Movement: Present. denies leaking of fluid. Review of Systems:   Pertinent items are noted in HPI Denies abnormal vaginal discharge w/ itching/odor/irritation, headaches, visual changes, shortness of breath, chest pain, abdominal pain, severe nausea/vomiting, or problems with urination or bowel movements unless otherwise stated above. Pertinent History Reviewed:  Reviewed past medical,surgical, social, obstetrical and family history.  Reviewed problem list, medications and allergies. Physical Assessment:   Vitals:   01/16/22 0849  BP: 120/79  Pulse: 97  Weight: 138 lb 8 oz (62.8 kg)  Body mass index is 25.33 kg/m.        Physical Examination:   General appearance: Well appearing, and in no distress  Mental status: Alert, oriented to person, place, and time  Skin: Warm & dry  Cardiovascular: Normal heart rate noted  Respiratory: Normal respiratory effort, no distress  Abdomen: Soft, gravid, nontender  Pelvic: Cervical exam deferred         Extremities: Edema: None  Fetal Status:     Movement: Present    Chaperone: n/a    No results found for this or any previous visit (from the past 24 hour(s)).  Assessment & Plan:  1) Low-risk pregnancy G1P0 at [redacted]w[redacted]d with an Estimated Date of Delivery: 04/10/22     Meds: No orders of the defined types were placed in this encounter.  Labs/procedures today: PN2  Plan:  Continue routine obstetrical care  Next visit: prefers in person    Reviewed: Preterm labor symptoms and general obstetric precautions including but not  limited to vaginal bleeding, contractions, leaking of fluid and fetal movement were reviewed in detail with the patient.  All questions were answered. Has home bp cuff.. Check bp weekly, let us know if >140/90.   Follow-up: Return in about 3 weeks (around 02/06/2022) for LROB.  No orders of the defined types were placed in this encounter.  Jacklyn Shell DNP, CNM 01/16/2022 9:18 AM

## 2022-01-17 LAB — CBC
Hematocrit: 34.7 % (ref 34.0–46.6)
Hemoglobin: 11.7 g/dL (ref 11.1–15.9)
MCH: 30.9 pg (ref 26.6–33.0)
MCHC: 33.7 g/dL (ref 31.5–35.7)
MCV: 92 fL (ref 79–97)
Platelets: 182 10*3/uL (ref 150–450)
RBC: 3.79 x10E6/uL (ref 3.77–5.28)
RDW: 12.2 % (ref 11.7–15.4)
WBC: 6.6 10*3/uL (ref 3.4–10.8)

## 2022-01-17 LAB — GLUCOSE TOLERANCE, 2 HOURS W/ 1HR
Glucose, 1 hour: 147 mg/dL (ref 70–179)
Glucose, 2 hour: 80 mg/dL (ref 70–152)
Glucose, Fasting: 72 mg/dL (ref 70–91)

## 2022-01-17 LAB — ANTIBODY SCREEN: Antibody Screen: NEGATIVE

## 2022-01-17 LAB — RPR: RPR Ser Ql: NONREACTIVE

## 2022-01-17 LAB — HIV ANTIBODY (ROUTINE TESTING W REFLEX): HIV Screen 4th Generation wRfx: NONREACTIVE

## 2022-02-06 ENCOUNTER — Encounter: Payer: Self-pay | Admitting: Advanced Practice Midwife

## 2022-02-06 ENCOUNTER — Ambulatory Visit (INDEPENDENT_AMBULATORY_CARE_PROVIDER_SITE_OTHER): Payer: Medicaid Other | Admitting: Advanced Practice Midwife

## 2022-02-06 VITALS — BP 123/85 | HR 106 | Wt 145.0 lb

## 2022-02-06 DIAGNOSIS — Z3A31 31 weeks gestation of pregnancy: Secondary | ICD-10-CM

## 2022-02-06 DIAGNOSIS — Z3403 Encounter for supervision of normal first pregnancy, third trimester: Secondary | ICD-10-CM

## 2022-02-06 NOTE — Patient Instructions (Signed)
[facility], I greatly value your feedback.  If you receive a survey following your visit with [place] today, we appreciate you taking the time to fill it out.  Thanks, [name], DNP, CNM  [facility] HAS MOVED!!! It is now [facility] ([address] [place], [place] [address]) Entrance located off of [address] Free 24/7 valet parking   Go to [email or web address] to register for FREE online childbirth classes    Call the office [phone]) or go to [facility] if: You begin to have strong, frequent contractions Your water breaks.  Sometimes it is a big gush of fluid, sometimes it is just a trickle that keeps getting your panties wet or running down your legs You have vaginal bleeding.  It is normal to have a small amount of spotting if your cervix was checked.  You don't feel your baby moving like normal.  If you don't, get you something to eat and drink and lay down and focus on feeling your baby move.  You should feel at least 10 movements in 2 hours.  If you don't, you should call the office or go to [facility].   Home Blood Pressure Monitoring for Patients   Your provider has recommended that you check your blood pressure (BP) at least once a week at home. If you do not have a blood pressure cuff at home, one will be provided for you. Contact your provider if you have not received your monitor within 1 week.   Helpful Tips for Accurate Home Blood Pressure Checks  Don't smoke, exercise, or drink caffeine 30 minutes before checking your BP Use the restroom before checking your BP (a full bladder can raise your pressure) Relax in a comfortable upright chair Feet on the ground Left arm resting comfortably on a flat surface at the level of your heart Legs uncrossed Back supported Sit quietly and don't talk Place the cuff on your bare arm Adjust snuggly, so that only two fingertips can fit between your skin and the  top of the cuff Check 2 readings separated by at least one minute Keep a log of your BP readings For a visual, please reference this diagram: http://ccnc.care/bpdiagram  Provider Name: [facility]     Phone: [phone]  Zone 1: ALL CLEAR  Continue to monitor your symptoms:  BP reading is less than 140 (top number) or less than 90 (bottom number)  No right upper stomach pain No headaches or seeing spots No feeling nauseated or throwing up No swelling in face and hands  Zone 2: CAUTION Call your doctor's office for any of the following:  BP reading is greater than 140 (top number) or greater than 90 (bottom number)  Stomach pain under your ribs in the middle or right side Headaches or seeing spots Feeling nauseated or throwing up Swelling in face and hands  Zone 3: EMERGENCY  Seek immediate medical care if you have any of the following:  BP reading is greater than160 (top number) or greater than 110 (bottom number) Severe headaches not improving with Tylenol Serious difficulty catching your breath Any worsening symptoms from Zone 2

## 2022-02-06 NOTE — Progress Notes (Signed)
   LOW-RISK PREGNANCY VISIT Patient name: Cindy [facility] MRN [id number]  Date of birth: 03-Nov-2000 Chief Complaint:   Routine Prenatal Visit  History of Present Illness:   Cindy Randall is a 21 y.o. G1P0 female at [redacted]w[redacted]d with an Estimated Date of Delivery: 04/10/22 being seen today for ongoing management of a low-risk pregnancy.  Today she reports no complaints. Contractions: Not present. Vag. Bleeding: None.  Movement: Present. denies leaking of fluid. Review of Systems:   Pertinent items are noted in HPI Denies abnormal vaginal discharge w/ itching/odor/irritation, headaches, visual changes, shortness of breath, chest pain, abdominal pain, severe nausea/vomiting, or problems with urination or bowel movements unless otherwise stated above. Pertinent History Reviewed:  Reviewed past medical,surgical, social, obstetrical and family history.  Reviewed problem list, medications and allergies. Physical Assessment:   Vitals:   02/06/22 1154  BP: 123/85  Pulse: (!) 106  Weight: 145 lb (65.8 kg)  Body mass index is 26.52 kg/m.        Physical Examination:   General appearance: Well appearing, and in no distress  Mental status: Alert, oriented to person, place, and time  Skin: Warm & dry  Cardiovascular: Normal heart rate noted  Respiratory: Normal respiratory effort, no distress  Abdomen: Soft, gravid, nontender  Pelvic: Cervical exam deferred         Extremities: Edema: None  Fetal Status: Fetal Heart Rate (bpm): 140 Fundal Height: 31 cm Movement: Present    Chaperone: n/a    No results found for this or any previous visit (from the past 24 hour(s)).  Assessment & Plan:  1) Low-risk pregnancy G1P0 at [redacted]w[redacted]d with an Estimated Date of Delivery: 04/10/22      Meds: No orders of the defined types were placed in this encounter.  Labs/procedures today:   Plan:  Continue routine obstetrical care Next visit: prefers in person    Reviewed: Preterm labor symptoms and general  obstetric precautions including but not limited to vaginal bleeding, contractions, leaking of fluid and fetal movement were reviewed in detail with the patient.  All questions were answered. Has home bp cuff. Check bp weekly, let us know if >140/90.   Follow-up: Return in about 2 weeks (around 02/20/2022) for LROB.  No future appointments.  No orders of the defined types were placed in this encounter.  Jacklyn Shell DNP, CNM 02/06/2022 12:13 PM

## 2022-02-20 ENCOUNTER — Ambulatory Visit (INDEPENDENT_AMBULATORY_CARE_PROVIDER_SITE_OTHER): Payer: Medicaid Other | Admitting: Advanced Practice Midwife

## 2022-02-20 VITALS — BP 119/76 | HR 98 | Wt 152.0 lb

## 2022-02-20 DIAGNOSIS — Z3402 Encounter for supervision of normal first pregnancy, second trimester: Secondary | ICD-10-CM

## 2022-02-20 DIAGNOSIS — R1011 Right upper quadrant pain: Secondary | ICD-10-CM

## 2022-02-20 DIAGNOSIS — Z23 Encounter for immunization: Secondary | ICD-10-CM | POA: Diagnosis not present

## 2022-02-20 DIAGNOSIS — Z3A33 33 weeks gestation of pregnancy: Secondary | ICD-10-CM

## 2022-02-20 NOTE — Progress Notes (Signed)
   LOW-RISK PREGNANCY VISIT Patient name: Cindy Randall MRN [id number]  Date of birth: August 10, 2000 Chief Complaint:   Routine Prenatal Visit  History of Present Illness:   Cindy Randall is a 21 y.o. G1P0 female at [redacted]w[redacted]d with an Estimated Date of Delivery: 04/10/22 being seen today for ongoing management of a low-risk pregnancy.  Today she reports RUQ pain for several hours.  Ate fried pickels and mozerrella sticks.  ? GB. Contractions: Not present. Vag. Bleeding: None.  Movement: Present. denies leaking of fluid. Review of Systems:   Pertinent items are noted in HPI Denies abnormal vaginal discharge w/ itching/odor/irritation, headaches, visual changes, shortness of breath, chest pain, abdominal pain, severe nausea/vomiting, or problems with urination or bowel movements unless otherwise stated above. Pertinent History Reviewed:  Reviewed past medical,surgical, social, obstetrical and family history.  Reviewed problem list, medications and allergies. Physical Assessment:   Vitals:   02/20/22 1202  BP: 119/76  Pulse: 98  Weight: 152 lb (68.9 kg)  Body mass index is 27.8 kg/m.        Physical Examination:   General appearance: Well appearing, and in no distress  Mental status: Alert, oriented to person, place, and time  Skin: Warm & dry  Cardiovascular: Normal heart rate noted  Respiratory: Normal respiratory effort, no distress  Abdomen: Soft, gravid, nontender  Pelvic: Cervical exam deferred         Extremities: Edema: None  Fetal Status: Fetal Heart Rate (bpm): 152 Fundal Height: 33 cm Movement: Present    Chaperone: n/a    No results found for this or any previous visit (from the past 24 hour(s)).  Assessment & Plan:  1) Low-risk pregnancy G1P0 at [redacted]w[redacted]d with an Estimated Date of Delivery: 04/10/22     Meds: No orders of the defined types were placed in this encounter.  Labs/procedures today:   Plan:  Continue routine obstetrical care  Next visit: prefers in person     Reviewed: Preterm labor symptoms and general obstetric precautions including but not limited to vaginal bleeding, contractions, leaking of fluid and fetal movement were reviewed in detail with the patient.  All questions were answered  Has home bp cuff.. Check bp weekly, let us know if >140/90.   Follow-up: Return in about 2 weeks (around 03/06/2022) for [place].  Future Appointments  Date Time Provider [place]  03/06/2022  2:30 PM Christin Fudge, [place] [facility]  03/13/2022  2:30 PM Christin Fudge, CNM [facility]  03/20/2022 10:50 AM Christin Fudge, CNM [facility]  03/27/2022 10:30 AM Janyth Pupa, DO [facility]  04/03/2022 10:10 AM Cresenzo-Dishmon, Joaquim Lai, CNM [facility]    Orders Placed This Encounter  Procedures   Tdap vaccine greater than or equal to 7yo IM   Comprehensive metabolic panel   Christin Fudge DNP, CNM 02/20/2022 1:45 PM

## 2022-02-20 NOTE — Patient Instructions (Signed)
Cindy Randall, I greatly value your feedback.  If you receive a survey following your visit with [place] today, we appreciate you taking the time to fill it out.  Thanks, Nigel Berthold, CNM   [place]!!! It is now [place] at [facility] ([place], [place] [address]) Entrance located off of [address] parking   Go to [email or web address] to register for FREE online childbirth classes    Call the office [phone]) or go to [facility] if: You begin to have strong, frequent contractions Your water breaks.  Sometimes it is a big gush of fluid, sometimes it is just a trickle that keeps getting your panties wet or running down your legs You have vaginal bleeding.  It is normal to have a small amount of spotting if your cervix was checked.  You don't feel your baby moving like normal.  If you don't, get you something to eat and drink and lay down and focus on feeling your baby move.  You should feel at least 10 movements in 2 hours.  If you don't, you should call the office or go to [facility]    Tdap Vaccine It is recommended that you get the Tdap vaccine during the third trimester of EACH pregnancy to help protect your baby from getting pertussis (whooping cough) 27-36 weeks is the BEST time to do this so that you can pass the protection on to your baby. During pregnancy is better than after pregnancy, but if you are unable to get it during pregnancy it will be offered at the hospital.  You will be offered this vaccine in the office after 27 weeks. If you do not have health insurance, you can get this vaccine at the health department or your family doctor Everyone who will be around your baby should also be up-to-date on their vaccines. Adults (who are not pregnant) only need 1 dose of Tdap during adulthood.   Third Trimester of Pregnancy The third trimester is from week 29 through week 42, months 7 through 9. The third  trimester is a time when the fetus is growing rapidly. At the end of the ninth month, the fetus is about 20 inches in length and weighs 6-10 pounds.  BODY CHANGES Your body goes through many changes during pregnancy. The changes vary from woman to woman.  Your weight will continue to increase. You can expect to gain 25-35 pounds (11-16 kg) by the end of the pregnancy. You may begin to get stretch marks on your hips, abdomen, and breasts. You may urinate more often because the fetus is moving lower into your pelvis and pressing on your bladder. You may develop or continue to have heartburn as a result of your pregnancy. You may develop constipation because certain hormones are causing the muscles that push waste through your intestines to slow down. You may develop hemorrhoids or swollen, bulging veins (varicose veins). You may have pelvic pain because of the weight gain and pregnancy hormones relaxing your joints between the bones in your pelvis. Backaches may result from overexertion of the muscles supporting your posture. You may have changes in your hair. These can include thickening of your hair, rapid growth, and changes in texture. Some women also have hair loss during or after pregnancy, or hair that feels dry or thin. Your hair will most likely return to normal after your baby is born. Your breasts will continue to grow and be tender. A  yellow discharge may leak from your breasts called colostrum. Your belly button may stick out. You may feel short of breath because of your expanding uterus. You may notice the fetus "dropping," or moving lower in your abdomen. You may have a bloody mucus discharge. This usually occurs a few days to a week before labor begins. Your cervix becomes thin and soft (effaced) near your due date. WHAT TO EXPECT AT YOUR PRENATAL EXAMS  You will have prenatal exams every 2 weeks until week 36. Then, you will have weekly prenatal exams. During a routine prenatal  visit: You will be weighed to make sure you and the fetus are growing normally. Your blood pressure is taken. Your abdomen will be measured to track your baby's growth. The fetal heartbeat will be listened to. Any test results from the previous visit will be discussed. You may have a cervical check near your due date to see if you have effaced. At around 36 weeks, your caregiver will check your cervix. At the same time, your caregiver will also perform a test on the secretions of the vaginal tissue. This test is to determine if a type of bacteria, Group B streptococcus, is present. Your caregiver will explain this further. Your caregiver may ask you: What your birth plan is. How you are feeling. If you are feeling the baby move. If you have had any abnormal symptoms, such as leaking fluid, bleeding, severe headaches, or abdominal cramping. If you have any questions. Other tests or screenings that may be performed during your third trimester include: Blood tests that check for low iron levels (anemia). Fetal testing to check the health, activity level, and growth of the fetus. Testing is done if you have certain medical conditions or if there are problems during the pregnancy. FALSE LABOR You may feel small, irregular contractions that eventually go away. These are called Braxton Hicks contractions, or false labor. Contractions may last for hours, days, or even weeks before true labor sets in. If contractions come at regular intervals, intensify, or become painful, it is best to be seen by your caregiver.  SIGNS OF LABOR  Menstrual-like cramps. Contractions that are 5 minutes apart or less. Contractions that start on the top of the uterus and spread down to the lower abdomen and back. A sense of increased pelvic pressure or back pain. A watery or bloody mucus discharge that comes from the vagina. If you have any of these signs before the 37th week of pregnancy, call your caregiver right away.  You need to go to the hospital to get checked immediately. HOME CARE INSTRUCTIONS  Avoid all smoking, herbs, alcohol, and unprescribed drugs. These chemicals affect the formation and growth of the baby. Follow your caregiver's instructions regarding medicine use. There are medicines that are either safe or unsafe to take during pregnancy. Exercise only as directed by your caregiver. Experiencing uterine cramps is a good sign to stop exercising. Continue to eat regular, healthy meals. Wear a good support bra for breast tenderness. Do not use hot tubs, steam rooms, or saunas. Wear your seat belt at all times when driving. Avoid raw meat, uncooked cheese, cat litter boxes, and soil used by cats. These carry germs that can cause birth defects in the baby. Take your prenatal vitamins. Try taking a stool softener (if your caregiver approves) if you develop constipation. Eat more high-fiber foods, such as fresh vegetables or fruit and whole grains. Drink plenty of fluids to keep your urine clear or pale yellow.  Take warm sitz baths to soothe any pain or discomfort caused by hemorrhoids. Use hemorrhoid cream if your caregiver approves. If you develop varicose veins, wear support hose. Elevate your feet for 15 minutes, 3-4 times a day. Limit salt in your diet. Avoid heavy lifting, wear low heal shoes, and practice good posture. Rest a lot with your legs elevated if you have leg cramps or low back pain. Visit your dentist if you have not gone during your pregnancy. Use a soft toothbrush to brush your teeth and be gentle when you floss. A sexual relationship may be continued unless your caregiver directs you otherwise. Do not travel far distances unless it is absolutely necessary and only with the approval of your caregiver. Take prenatal classes to understand, practice, and ask questions about the labor and delivery. Make a trial run to the hospital. Pack your hospital bag. Prepare the baby's  nursery. Continue to go to all your prenatal visits as directed by your caregiver. SEEK MEDICAL CARE IF: You are unsure if you are in labor or if your water has broken. You have dizziness. You have mild pelvic cramps, pelvic pressure, or nagging pain in your abdominal area. You have persistent nausea, vomiting, or diarrhea. You have a bad smelling vaginal discharge. You have pain with urination. SEEK IMMEDIATE MEDICAL CARE IF:  You have a fever. You are leaking fluid from your vagina. You have spotting or bleeding from your vagina. You have severe abdominal cramping or pain. You have rapid weight loss or gain. You have shortness of breath with chest pain. You notice sudden or extreme swelling of your face, hands, ankles, feet, or legs. You have not felt your baby move in over an hour. You have severe headaches that do not go away with medicine. You have vision changes. Document Released: 05/13/2001 Document Revised: 05/24/2013 Document Reviewed: 07/20/2012 [facility] Patient Information 2015 [place], [place]. This information is not intended to replace advice given to you by your health care provider. Make sure you discuss any questions you have with your health care provider.

## 2022-03-06 ENCOUNTER — Ambulatory Visit (INDEPENDENT_AMBULATORY_CARE_PROVIDER_SITE_OTHER): Payer: Medicaid Other | Admitting: Advanced Practice Midwife

## 2022-03-06 ENCOUNTER — Encounter: Payer: Self-pay | Admitting: Advanced Practice Midwife

## 2022-03-06 VITALS — BP 122/82 | HR 95 | Wt 157.0 lb

## 2022-03-06 DIAGNOSIS — Z3A35 35 weeks gestation of pregnancy: Secondary | ICD-10-CM

## 2022-03-06 DIAGNOSIS — Z3403 Encounter for supervision of normal first pregnancy, third trimester: Secondary | ICD-10-CM

## 2022-03-06 NOTE — Progress Notes (Signed)
   LOW-RISK PREGNANCY VISIT Patient name: Cindy Randall MRN [id number]  Date of birth: 06/11/2000 Chief Complaint:   Routine Prenatal Visit  History of Present Illness:   Cindy Randall is a 21 y.o. G1P0 female at [redacted]w[redacted]d with an Estimated Date of Delivery: 04/10/22 being seen today for ongoing management of a low-risk pregnancy.  Today she reports no complaints. Contractions: Irritability. Vag. Bleeding: None.  Movement: Present. denies leaking of fluid. Review of Systems:   Pertinent items are noted in HPI Denies abnormal vaginal discharge w/ itching/odor/irritation, headaches, visual changes, shortness of breath, chest pain, abdominal pain, severe nausea/vomiting, or problems with urination or bowel movements unless otherwise stated above. Pertinent History Reviewed:  Reviewed past medical,surgical, social, obstetrical and family history.  Reviewed problem list, medications and allergies. Physical Assessment:   Vitals:   03/06/22 1429  BP: 122/82  Pulse: 95  Weight: 157 lb (71.2 kg)  Body mass index is 28.72 kg/m.        Physical Examination:   General appearance: Well appearing, and in no distress  Mental status: Alert, oriented to person, place, and time  Skin: Warm & dry  Cardiovascular: Normal heart rate noted  Respiratory: Normal respiratory effort, no distress  Abdomen: Soft, gravid, nontender  Pelvic: Cervical exam deferred         Extremities: Edema: Trace  Fetal Status: Fetal Heart Rate (bpm): 145 Fundal Height: 35 cm Movement: Present    Chaperone: n/a    No results found for this or any previous visit (from the past 24 hour(s)).  Assessment & Plan:  1) Low-risk pregnancy G1P0 at [redacted]w[redacted]d with an Estimated Date of Delivery: 04/10/22     Meds: No orders of the defined types were placed in this encounter.  Labs/procedures today: none  Plan:  Continue routine obstetrical care  Next visit: prefers in person    Reviewed: Preterm labor symptoms and general  obstetric precautions including but not limited to vaginal bleeding, contractions, leaking of fluid and fetal movement were reviewed in detail with the patient.  All questions were answered. Has home bp cuff.. Check bp weekly, let us know if >140/90.   Follow-up: No follow-ups on file.  Future Appointments  Date Time Provider [place]  03/13/2022  2:30 PM Christin Fudge, CNM [facility]  03/20/2022 10:50 AM Christin Fudge, CNM [facility]  03/27/2022 10:30 AM Janyth Pupa, DO [facility]  04/03/2022 10:10 AM Cresenzo-Dishmon, Joaquim Lai, CNM [facility]    No orders of the defined types were placed in this encounter.  Christin Fudge DNP, CNM 03/06/2022 2:50 PM

## 2022-03-06 NOTE — Patient Instructions (Addendum)
AM I IN LABOR? What is labor? Labor is the work that your body does to birth your baby. Your uterus (the womb) contracts. Your cervix (the mouth of the uterus) opens. You will push your baby out into the world.  What do contractions (labor pains) feel like? When they first start, contractions usually feel like cramps during your period. Sometimes you feel pain in your back. Most often, contractions feel like muscles pulling painfully in your lower belly. At first, the contractions will probably be 15 to 20 minutes apart. They will not feel too painful. As labor goes on, the contractions get stronger, closer together, and more painful.  How do I time the contractions? Time your contractions by counting the number of minutes from the start of one contraction to the start of the next contraction.  What should I do when the contractions start? If it is night and you can sleep, sleep. If it happens during the day, here are some things you can do to take care of yourself at home: ? Walk. If the pains you are having are real labor, walking will make the contractions come faster and harder. If the contractions are not going to continue and be real labor, walking will make the contractions slow down. ? Take a shower or bath. This will help you relax. ? Eat. Labor is a big event. It takes a lot of energy. ? Drink water. Not drinking enough water can cause false labor (contractions that hurt but do not open your cervix). If this is true labor, drinking water will help you have strength to get through your labor. ? Take a nap. Get all the rest you can. ? Get a massage. If your labor is in your back, a strong massage on your lower back may feel very good. Getting a foot massage is always good. ? Don't panic. You can do this. Your body was made for this. You are strong!  When should I go to the hospital or call my health care provider? ? Your contractions have been 5 minutes apart or less for at least 1  hour. ? If several contractions are so painful you cannot walk or talk during one. ? Your bag of waters breaks. (You may have a big gush of water or just water that runs down your legs when you walk.)  Are there other reasons to call my health care provider? Yes, you should call your health care provider or go to the hospital if you start to bleed like you are having a period-- blood that soaks your underwear or runs down your legs, if you have sudden severe pain, if your baby has not moved for several hours, or if you are leaking green fluid. The rule is as follows: If you are very concerned about something, call.   Carpal Tunnel Syndrome  Carpal tunnel syndrome is a condition that causes pain, numbness, and weakness in your hand and fingers. The carpal tunnel is a narrow area located on the palm side of your wrist. Repeated wrist motion or certain diseases may cause swelling within the tunnel. This swelling pinches the main nerve in the wrist. The main nerve in the wrist is called the median nerve. What are the causes? This condition may be caused by: Repeated and forceful wrist and hand motions. Wrist injuries. Arthritis. A cyst or tumor in the carpal tunnel. Fluid buildup during pregnancy. Use of tools that vibrate. Sometimes the cause of this condition is not known. What increases  the risk? The following factors may make you more likely to develop this condition: Having a job that requires you to repeatedly or forcefully move your wrist or hand or requires you to use tools that vibrate. This may include jobs that involve using computers, working on an [employer], or working with [employer] such as Pension scheme manager. Being a woman. Having certain conditions, such as: Diabetes. Obesity. An underactive thyroid (hypothyroidism). Kidney failure. Rheumatoid arthritis. What are the signs or symptoms? Symptoms of this condition include: A tingling feeling in your fingers, especially  in your thumb, index, and middle fingers. Tingling or numbness in your hand. An aching feeling in your entire arm, especially when your wrist and elbow are bent for a long time. Wrist pain that goes up your arm to your shoulder. Pain that goes down into your palm or fingers. A weak feeling in your hands. You may have trouble grabbing and holding items. Your symptoms may feel worse during the night. How is this diagnosed? This condition is diagnosed with a medical history and physical exam. You may also have tests, including: Electromyogram (EMG). This test measures electrical signals sent by your nerves into the muscles. Nerve conduction study. This test measures how well electrical signals pass through your nerves. Imaging tests, such as X-rays, ultrasound, and MRI. These tests check for possible causes of your condition. How is this treated? This condition may be treated with: Lifestyle changes. It is important to stop or change the activity that caused your condition. Doing exercise and activities to strengthen and stretch your muscles and tendons (physical therapy). Making lifestyle changes to help with your condition and learning how to do your daily activities safely (occupational therapy). Medicines for pain and inflammation. This may include medicine that is injected into your wrist. A wrist splint or brace. Surgery. Follow these instructions at home: If you have a splint or brace: Wear the splint or brace as told by your health care provider. Remove it only as told by your health care provider. Loosen the splint or brace if your fingers tingle, become numb, or turn cold and blue. Keep the splint or brace clean. If the splint or brace is not waterproof: Do not let it get wet. Cover it with a watertight covering when you take a bath or shower. Managing pain, stiffness, and swelling If directed, put ice on the painful area. To do this: If you have a removeable splint or brace,  remove it as told by your health care provider. Put ice in a plastic bag. Place a towel between your skin and the bag or between the splint or brace and the bag. Leave the ice on for 20 minutes, 2-3 times a day. Do not fall asleep with the cold pack on your skin. Remove the ice if your skin turns bright red. This is very important. If you cannot feel pain, heat, or cold, you have a greater risk of damage to the area. Move your fingers often to reduce stiffness and swelling. General instructions Take over-the-counter and prescription medicines only as told by your health care provider. Rest your wrist and hand from any activity that may be causing your pain. If your condition is work related, talk with your employer about changes that can be made, such as getting a wrist pad to use while typing. Do any exercises as told by your health care provider, physical therapist, or occupational therapist. Keep all follow-up visits. This is important. Contact a health care provider  if: You have new symptoms. Your pain is not controlled with medicines. Your symptoms get worse. Get help right away if: You have severe numbness or tingling in your wrist or hand. Summary Carpal tunnel syndrome is a condition that causes pain, numbness, and weakness in your hand and fingers. It is usually caused by repeated wrist motions. Lifestyle changes and medicines are used to treat carpal tunnel syndrome. Surgery may be recommended. Follow your health care provider's instructions about wearing a splint, resting from activity, keeping follow-up visits, and calling for help. This information is not intended to replace advice given to you by your health care provider. Make sure you discuss any questions you have with your health care provider. Document Revised: 09/29/2019 Document Reviewed: 09/29/2019 Elsevier Patient Education  [place].

## 2022-03-13 ENCOUNTER — Ambulatory Visit (INDEPENDENT_AMBULATORY_CARE_PROVIDER_SITE_OTHER): Payer: 59 | Admitting: Advanced Practice Midwife

## 2022-03-13 ENCOUNTER — Other Ambulatory Visit (HOSPITAL_COMMUNITY)
Admission: RE | Admit: 2022-03-13 | Discharge: 2022-03-13 | Disposition: A | Discharge status: Home / Self Care | Payer: Medicaid Other | Source: Ambulatory Visit | Attending: Advanced Practice Midwife | Admitting: Advanced Practice Midwife

## 2022-03-13 VITALS — BP 124/79 | HR 82 | Wt 161.0 lb

## 2022-03-13 DIAGNOSIS — Z3403 Encounter for supervision of normal first pregnancy, third trimester: Secondary | ICD-10-CM | POA: Diagnosis not present

## 2022-03-13 DIAGNOSIS — Z1332 Encounter for screening for maternal depression: Secondary | ICD-10-CM | POA: Diagnosis not present

## 2022-03-13 DIAGNOSIS — Z3A36 36 weeks gestation of pregnancy: Secondary | ICD-10-CM

## 2022-03-13 NOTE — Progress Notes (Signed)
   LOW-RISK PREGNANCY VISIT Patient name: Cindy Randall MRN [id number]  Date of birth: 24-Aug-2000 Chief Complaint:   Routine Prenatal Visit  History of Present Illness:   Cindy Randall is a 21 y.o. G1P0 female at [redacted]w[redacted]d with an Estimated Date of Delivery: 04/10/22 being seen today for ongoing management of a low-risk pregnancy.  Today she reports . Contractions: Irritability. Vag. Bleeding: None.  Movement: Present. denies leaking of fluid. Review of Systems:   Pertinent items are noted in HPI Denies abnormal vaginal discharge w/ itching/odor/irritation, headaches, visual changes, shortness of breath, chest pain, abdominal pain, severe nausea/vomiting, or problems with urination or bowel movements unless otherwise stated above. Pertinent History Reviewed:  Reviewed past medical,surgical, social, obstetrical and family history.  Reviewed problem list, medications and allergies. Physical Assessment:   Vitals:   03/13/22 1424  BP: 124/79  Pulse: 82  Weight: 161 lb (73 kg)  Body mass index is 29.45 kg/m.        Physical Examination:   General appearance: Well appearing, and in no distress  Mental status: Alert, oriented to person, place, and time  Skin: Warm & dry  Cardiovascular: Normal heart rate noted  Respiratory: Normal respiratory effort, no distress  Abdomen: Soft, gravid, nontender  Pelvic: pt did not tolerate a qtip in vagina, so did self swab for GC/CHL/BV andyeast. Vtx confirmed by [place]        Extremities: Edema: Trace  Fetal Status:     Movement: Present    Chaperone: Amanda Rash    No results found for this or any previous visit (from the past 24 hour(s)).  Assessment & Plan:  1) Low-risk pregnancy G1P0 at [redacted]w[redacted]d with an Estimated Date of Delivery: 04/10/22     Meds: No orders of the defined types were placed in this encounter.  Labs/procedures today: GBS, GC/CHL  Plan:  Continue routine obstetrical care  Next visit: prefers in person    Reviewed: Term  labor symptoms and general obstetric precautions including but not limited to vaginal bleeding, contractions, leaking of fluid and fetal movement were reviewed in detail with the patient.  All questions were answered. Has home bp cuff. Check bp weekly, let us know if >140/90.   Follow-up: No follow-ups on file.  Future Appointments  Date Time Provider [place]  03/20/2022 10:50 AM Christin Fudge, CNM [facility]  03/27/2022 10:30 AM Janyth Pupa, DO [facility]  04/03/2022 10:10 AM Vevelyn Pat, Joaquim Lai, CNM [facility]    Orders Placed This Encounter  Procedures   Culture, beta strep (group b only)   Christin Fudge DNP, CNM 03/13/2022 2:58 PM

## 2022-03-13 NOTE — Patient Instructions (Signed)

## 2022-03-17 ENCOUNTER — Encounter: Payer: Self-pay | Admitting: Advanced Practice Midwife

## 2022-03-17 ENCOUNTER — Other Ambulatory Visit: Payer: Self-pay | Admitting: Advanced Practice Midwife

## 2022-03-17 DIAGNOSIS — Z3402 Encounter for supervision of normal first pregnancy, second trimester: Secondary | ICD-10-CM

## 2022-03-17 LAB — CERVICOVAGINAL ANCILLARY ONLY
Bacterial Vaginitis (gardnerella): POSITIVE — AB
Candida Glabrata: NEGATIVE
Candida Vaginitis: POSITIVE — AB
Chlamydia: NEGATIVE
Comment: NEGATIVE
Comment: NEGATIVE
Comment: NEGATIVE
Comment: NEGATIVE
Comment: NEGATIVE
Comment: NORMAL
Neisseria Gonorrhea: NEGATIVE
Trichomonas: POSITIVE — AB

## 2022-03-17 LAB — CULTURE, BETA STREP (GROUP B ONLY): Strep Gp B Culture: NEGATIVE

## 2022-03-17 MED ORDER — METRONIDAZOLE 500 MG PO TABS: 500.0000 mg | ORAL_TABLET | Freq: Two times a day (BID) | ORAL | 0 refills | Status: DC

## 2022-03-17 MED ORDER — TERCONAZOLE 0.4 % VA CREA: 1.0000 | TOPICAL_CREAM | Freq: Every day | VAGINAL | 0 refills | Status: DC

## 2022-03-17 NOTE — Progress Notes (Signed)
Flagyl for BV/TRich.  Terezol for yeast

## 2022-03-20 ENCOUNTER — Ambulatory Visit (INDEPENDENT_AMBULATORY_CARE_PROVIDER_SITE_OTHER): Payer: Medicaid Other | Admitting: Advanced Practice Midwife

## 2022-03-20 ENCOUNTER — Encounter: Payer: Self-pay | Admitting: Advanced Practice Midwife

## 2022-03-20 VITALS — BP 127/82 | HR 82 | Wt 165.0 lb

## 2022-03-20 DIAGNOSIS — Z3A37 37 weeks gestation of pregnancy: Secondary | ICD-10-CM

## 2022-03-20 DIAGNOSIS — Z3402 Encounter for supervision of normal first pregnancy, second trimester: Secondary | ICD-10-CM

## 2022-03-20 DIAGNOSIS — A599 Trichomoniasis, unspecified: Secondary | ICD-10-CM

## 2022-03-20 NOTE — Progress Notes (Signed)
   LOW-RISK PREGNANCY VISIT Patient name: Cindy Randall MRN [id number]  Date of birth: 2000-07-07 Chief Complaint:   No chief complaint on file.  History of Present Illness:   Cindy Randall is a 21 y.o. G1P0 female at [redacted]w[redacted]d with an Estimated Date of Delivery: 04/10/22 being seen today for ongoing management of a low-risk pregnancy.  Today she reports taking her meds for trich and yeast.  FOB has taken his. Contractions: Irritability. Vag. Bleeding: None.  Movement: Present. denies leaking of fluid. Review of Systems:   Pertinent items are noted in HPI Denies abnormal vaginal discharge w/ itching/odor/irritation, headaches, visual changes, shortness of breath, chest pain, abdominal pain, severe nausea/vomiting, or problems with urination or bowel movements unless otherwise stated above. Pertinent History Reviewed:  Reviewed past medical,surgical, social, obstetrical and family history.  Reviewed problem list, medications and allergies. Physical Assessment:   Vitals:   03/20/22 1111  BP: 127/82  Pulse: 82  Weight: 165 lb (74.8 kg)  Body mass index is 30.18 kg/m.        Physical Examination:   General appearance: Well appearing, and in no distress  Mental status: Alert, oriented to person, place, and time  Skin: Warm & dry  Cardiovascular: Normal heart rate noted  Respiratory: Normal respiratory effort, no distress  Abdomen: Soft, gravid, nontender  Pelvic: Cervical exam deferred         Extremities: Edema: Trace  Fetal Status: Fetal Heart Rate (bpm): 140 Fundal Height: 37 cm Movement: Present    Chaperone: n/a    No results found for this or any previous visit (from the past 24 hour(s)).  Assessment & Plan:  1) Low-risk pregnancy G1P0 at [redacted]w[redacted]d with an Estimated Date of Delivery: 04/10/22      Meds: No orders of the defined types were placed in this encounter.  Labs/procedures today:   Plan:  Continue routine obstetrical care  Next visit: prefers in person     Reviewed: Term labor symptoms and general obstetric precautions including but not limited to vaginal bleeding, contractions, leaking of fluid and fetal movement were reviewed in detail with the patient.  All questions were answered. Has home bp cuff. . Check bp weekly, let us know if >140/90.   Follow-up: No follow-ups on file.  Future Appointments  Date Time Provider [place]  03/27/2022 10:30 AM Janyth Pupa, DO [facility]  04/03/2022 10:10 AM Cresenzo-Dishmon, Joaquim Lai, CNM [facility]    No orders of the defined types were placed in this encounter.  Christin Fudge DNP, CNM 03/20/2022 11:33 AM

## 2022-03-27 ENCOUNTER — Ambulatory Visit (INDEPENDENT_AMBULATORY_CARE_PROVIDER_SITE_OTHER): Payer: Medicaid Other | Admitting: Obstetrics & Gynecology

## 2022-03-27 ENCOUNTER — Encounter: Payer: Self-pay | Admitting: Obstetrics & Gynecology

## 2022-03-27 VITALS — BP 131/95 | HR 93 | Wt 168.0 lb

## 2022-03-27 DIAGNOSIS — Z3403 Encounter for supervision of normal first pregnancy, third trimester: Secondary | ICD-10-CM

## 2022-03-27 DIAGNOSIS — Z3A38 38 weeks gestation of pregnancy: Secondary | ICD-10-CM

## 2022-03-27 NOTE — Progress Notes (Signed)
   LOW-RISK PREGNANCY VISIT Patient name: Cindy Randall MRN [id number]  Date of birth: 12/06/2000 Chief Complaint:   Routine Prenatal Visit  History of Present Illness:   Cindy Randall is a 21 y.o. G1P0 female at [redacted]w[redacted]d with an Estimated Date of Delivery: 04/10/22 being seen today for ongoing management of a low-risk pregnancy.   +Trich- just finished treatment on Monday.  Currently asymptomatic     03/13/2022    3:06 PM 10/02/2021   11:19 AM  Depression screen PHQ 2/9  Decreased Interest 3 0  Down, Depressed, Hopeless 0 1  PHQ - 2 Score 3 1  Altered sleeping 1 1  Tired, decreased energy 3 0  Change in appetite 0 0  Feeling bad or failure about yourself  0 0  Trouble concentrating 0 0  Moving slowly or fidgety/restless 0 0  Suicidal thoughts 0 0  PHQ-9 Score 7 2    Today she reports no complaints. Contractions: Irritability. Vag. Bleeding: None.  Movement: Present. denies leaking of fluid. Review of Systems:   Pertinent items are noted in HPI Denies abnormal vaginal discharge w/ itching/odor/irritation, headaches, visual changes, shortness of breath, chest pain, abdominal pain, severe nausea/vomiting, or problems with urination or bowel movements unless otherwise stated above. Pertinent History Reviewed:  Reviewed past medical,surgical, social, obstetrical and family history.  Reviewed problem list, medications and allergies.  Physical Assessment:   Vitals:   03/27/22 1037  BP: (!) 131/95  Pulse: 93  Weight: 168 lb (76.2 kg)  Body mass index is 30.73 kg/m.        Physical Examination:   General appearance: Well appearing, and in no distress  Mental status: Alert, oriented to person, place, and time  Skin: Warm & dry  Respiratory: Normal respiratory effort, no distress  Abdomen: Soft, gravid, nontender  Pelvic: Cervical exam performed  Difficulty with exam due to discomfort- unable to complete check       Extremities: Edema: Trace  Psych:  mood and affect  appropriate  Fetal Status: Fetal Heart Rate (bpm): 150 Fundal Height: 38 cm Movement: Present Presentation: Vertex  Chaperone:  pt declined     No results found for this or any previous visit (from the past 24 hour(s)).   Assessment & Plan:  1) Low-risk pregnancy G1P0 at [redacted]w[redacted]d with an Estimated Date of Delivery: 04/10/22   2) +Trich, []  TOC next visit   Meds: No orders of the defined types were placed in this encounter.  Labs/procedures today: none  Plan:  Continue routine obstetrical care  Next visit: prefers in person    Reviewed: Term labor symptoms and general obstetric precautions including but not limited to vaginal bleeding, contractions, leaking of fluid and fetal movement were reviewed in detail with the patient.  All questions were answered. Pt has home bp cuff. Check bp weekly, let us know if >140/90. Encouraged pt to bring log of BPs next visit.  Reviewed preeclampsia precautions.  Follow-up: Return in about 1 week (around 04/03/2022) for [place] visit.  No orders of the defined types were placed in this encounter.   Janyth Pupa, DO Attending [place], [facility] for [employer], [place]

## 2022-04-03 ENCOUNTER — Ambulatory Visit (INDEPENDENT_AMBULATORY_CARE_PROVIDER_SITE_OTHER): Payer: Medicaid Other | Admitting: Advanced Practice Midwife

## 2022-04-03 ENCOUNTER — Other Ambulatory Visit (HOSPITAL_COMMUNITY)
Admission: RE | Admit: 2022-04-03 | Discharge: 2022-04-03 | Disposition: A | Discharge status: Home / Self Care | Payer: Medicaid Other | Source: Ambulatory Visit | Attending: Advanced Practice Midwife | Admitting: Advanced Practice Midwife

## 2022-04-03 VITALS — BP 128/89 | HR 104 | Wt 171.0 lb

## 2022-04-03 DIAGNOSIS — Z8619 Personal history of other infectious and parasitic diseases: Secondary | ICD-10-CM | POA: Diagnosis present

## 2022-04-03 DIAGNOSIS — Z113 Encounter for screening for infections with a predominantly sexual mode of transmission: Secondary | ICD-10-CM

## 2022-04-03 DIAGNOSIS — O26843 Uterine size-date discrepancy, third trimester: Secondary | ICD-10-CM

## 2022-04-03 DIAGNOSIS — Z3403 Encounter for supervision of normal first pregnancy, third trimester: Secondary | ICD-10-CM

## 2022-04-03 DIAGNOSIS — Z3A39 39 weeks gestation of pregnancy: Secondary | ICD-10-CM

## 2022-04-03 NOTE — Progress Notes (Signed)
   LOW-RISK PREGNANCY VISIT Patient name: Cindy Randall MRN [id number]  Date of birth: [birth year] Chief Complaint:   Routine Prenatal Visit (Patient states that she is having vaginal discharge. )  History of Present Illness:   Cindy Randall is a 21 y.o. G1P0 female at [redacted]w[redacted]d with an Estimated Date of Delivery: 04/10/22 being seen today for ongoing management of a low-risk pregnancy.  Today she reports no complaints. Contractions: Not present. Vag. Bleeding: None.  Movement: Present. denies leaking of fluid. Review of Systems:   Pertinent items are noted in HPI Denies abnormal vaginal discharge w/ itching/odor/irritation, headaches, visual changes, shortness of breath, chest pain, abdominal pain, severe nausea/vomiting, or problems with urination or bowel movements unless otherwise stated above. Pertinent History Reviewed:  Reviewed past medical,surgical, social, obstetrical and family history.  Reviewed problem list, medications and allergies. Physical Assessment:   Vitals:   04/03/22 1016  BP: 128/89  Pulse: (!) 104  Weight: 171 lb (77.6 kg)  Body mass index is 31.28 kg/m.        Physical Examination:   General appearance: Well appearing, and in no distress  Mental status: Alert, oriented to person, place, and time  Skin: Warm & dry  Cardiovascular: Normal heart rate noted  Respiratory: Normal respiratory effort, no distress  Abdomen: Soft, gravid, nontender  Pelvic: Cervical exam performed  Dilation: Fingertip Effacement (%): 50 Station: -1  Extremities: Edema: None  Fetal Status: Fetal Heart Rate (bpm): 142 Fundal Height: 32 cm Movement: Present    Chaperone:  Callandra, RN     No results found for this or any previous visit (from the past 24 hour(s)).  Assessment & Plan:  1) Low-risk pregnancy G1P0 at [redacted]w[redacted]d with an Estimated Date of Delivery: 04/10/22   2) Trich 10/12, POC today  3)  Size<dates:  EFW/AFT   Meds: No orders of the defined types were placed in this  encounter.  Labs/procedures today: TOC trich  Plan:  Continue routine obstetrical care  Next visit: prefers in person    Reviewed: Term labor symptoms and general obstetric precautions including but not limited to vaginal bleeding, contractions, leaking of fluid and fetal movement were reviewed in detail with the patient.  All questions were answered. Has home bp cuff. Check bp weekly, let us know if >140/90.   Follow-up: Return in about 1 week (around 04/10/2022) for [place], [place]  AND [place] for EFW asap.  No future appointments.  Orders Placed This Encounter  Procedures   US OB Follow Up   Christin Fudge DNP, CNM 04/03/2022 10:56 AM

## 2022-04-03 NOTE — Patient Instructions (Signed)

## 2022-04-03 NOTE — Addendum Note (Signed)
Addended by: Jesusita Oka on: 04/03/2022 11:02 AM   Modules accepted: Orders

## 2022-04-04 ENCOUNTER — Inpatient Hospital Stay (HOSPITAL_COMMUNITY): Payer: Medicaid Other | Admitting: Anesthesiology

## 2022-04-04 ENCOUNTER — Encounter (HOSPITAL_COMMUNITY): Payer: Self-pay | Admitting: Obstetrics and Gynecology

## 2022-04-04 ENCOUNTER — Ambulatory Visit (INDEPENDENT_AMBULATORY_CARE_PROVIDER_SITE_OTHER): Payer: Medicaid Other

## 2022-04-04 ENCOUNTER — Ambulatory Visit (INDEPENDENT_AMBULATORY_CARE_PROVIDER_SITE_OTHER): Payer: Medicaid Other | Admitting: Obstetrics & Gynecology

## 2022-04-04 ENCOUNTER — Other Ambulatory Visit: Payer: Self-pay

## 2022-04-04 ENCOUNTER — Other Ambulatory Visit: Payer: Self-pay | Admitting: Advanced Practice Midwife

## 2022-04-04 ENCOUNTER — Encounter: Payer: Self-pay | Admitting: Obstetrics & Gynecology

## 2022-04-04 ENCOUNTER — Inpatient Hospital Stay (HOSPITAL_COMMUNITY)
Admission: AD | Admit: 2022-04-04 | Discharge: 2022-04-07 | DRG: 807 | Disposition: A | Discharge status: Home / Self Care | Payer: Medicaid Other | Attending: Obstetrics and Gynecology | Admitting: Obstetrics and Gynecology

## 2022-04-04 VITALS — BP 140/97 | HR 98 | Wt 170.0 lb

## 2022-04-04 DIAGNOSIS — O365930 Maternal care for other known or suspected poor fetal growth, third trimester, not applicable or unspecified: Principal | ICD-10-CM | POA: Diagnosis present

## 2022-04-04 DIAGNOSIS — O26843 Uterine size-date discrepancy, third trimester: Secondary | ICD-10-CM | POA: Diagnosis not present

## 2022-04-04 DIAGNOSIS — O365990 Maternal care for other known or suspected poor fetal growth, unspecified trimester, not applicable or unspecified: Secondary | ICD-10-CM

## 2022-04-04 DIAGNOSIS — O0990 Supervision of high risk pregnancy, unspecified, unspecified trimester: Secondary | ICD-10-CM

## 2022-04-04 DIAGNOSIS — Z3A39 39 weeks gestation of pregnancy: Secondary | ICD-10-CM | POA: Diagnosis not present

## 2022-04-04 DIAGNOSIS — O134 Gestational [pregnancy-induced] hypertension without significant proteinuria, complicating childbirth: Secondary | ICD-10-CM | POA: Diagnosis present

## 2022-04-04 DIAGNOSIS — O139 Gestational [pregnancy-induced] hypertension without significant proteinuria, unspecified trimester: Secondary | ICD-10-CM | POA: Diagnosis present

## 2022-04-04 DIAGNOSIS — O133 Gestational [pregnancy-induced] hypertension without significant proteinuria, third trimester: Secondary | ICD-10-CM

## 2022-04-04 HISTORY — DX: Other specified health status: Z78.9

## 2022-04-04 LAB — POCT URINALYSIS DIPSTICK OB
Blood, UA: NEGATIVE
Glucose, UA: NEGATIVE
Ketones, UA: NEGATIVE
Leukocytes, UA: NEGATIVE
Nitrite, UA: NEGATIVE

## 2022-04-04 LAB — CBC WITH DIFFERENTIAL/PLATELET
Abs Immature Granulocytes: 0.09 10*3/uL — ABNORMAL HIGH (ref 0.00–0.07)
Basophils Absolute: 0 10*3/uL (ref 0.0–0.1)
Basophils Relative: 0 %
Eosinophils Absolute: 0.1 10*3/uL (ref 0.0–0.5)
Eosinophils Relative: 1 %
HCT: 32.4 % — ABNORMAL LOW (ref 36.0–46.0)
Hemoglobin: 11.1 g/dL — ABNORMAL LOW (ref 12.0–15.0)
Immature Granulocytes: 1 %
Lymphocytes Relative: 32 %
Lymphs Abs: 3.9 10*3/uL (ref 0.7–4.0)
MCH: 32.6 pg (ref 26.0–34.0)
MCHC: 34.3 g/dL (ref 30.0–36.0)
MCV: 95 fL (ref 80.0–100.0)
Monocytes Absolute: 1.4 10*3/uL — ABNORMAL HIGH (ref 0.1–1.0)
Monocytes Relative: 12 %
Neutro Abs: 6.7 10*3/uL (ref 1.7–7.7)
Neutrophils Relative %: 54 %
Platelets: 166 10*3/uL (ref 150–400)
RBC: 3.41 MIL/uL — ABNORMAL LOW (ref 3.87–5.11)
RDW: 15.6 % — ABNORMAL HIGH (ref 11.5–15.5)
WBC: 12.2 10*3/uL — ABNORMAL HIGH (ref 4.0–10.5)
nRBC: 0 % (ref 0.0–0.2)

## 2022-04-04 LAB — CBC
HCT: 32.7 % — ABNORMAL LOW (ref 36.0–46.0)
Hemoglobin: 10.9 g/dL — ABNORMAL LOW (ref 12.0–15.0)
MCH: 32.2 pg (ref 26.0–34.0)
MCHC: 33.3 g/dL (ref 30.0–36.0)
MCV: 96.7 fL (ref 80.0–100.0)
Platelets: 159 10*3/uL (ref 150–400)
RBC: 3.38 MIL/uL — ABNORMAL LOW (ref 3.87–5.11)
RDW: 15.7 % — ABNORMAL HIGH (ref 11.5–15.5)
WBC: 7.7 10*3/uL (ref 4.0–10.5)
nRBC: 0 % (ref 0.0–0.2)

## 2022-04-04 LAB — COMPREHENSIVE METABOLIC PANEL
ALT: 14 U/L (ref 0–44)
AST: 22 U/L (ref 15–41)
Albumin: 2.7 g/dL — ABNORMAL LOW (ref 3.5–5.0)
Alkaline Phosphatase: 165 U/L — ABNORMAL HIGH (ref 38–126)
Anion gap: 7 (ref 5–15)
BUN: 10 mg/dL (ref 6–20)
CO2: 22 mmol/L (ref 22–32)
Calcium: 9.1 mg/dL (ref 8.9–10.3)
Chloride: 108 mmol/L (ref 98–111)
Creatinine, Ser: 0.83 mg/dL (ref 0.44–1.00)
GFR, Estimated: >60 mL/min (ref >60)
Glucose, Bld: 101 mg/dL — ABNORMAL HIGH (ref 70–99)
Potassium: 3.9 mmol/L (ref 3.5–5.1)
Sodium: 137 mmol/L (ref 135–145)
Total Bilirubin: 0.3 mg/dL (ref 0.3–1.2)
Total Protein: 6.1 g/dL — ABNORMAL LOW (ref 6.5–8.1)

## 2022-04-04 LAB — TYPE AND SCREEN
ABO/RH(D): B POS
Antibody Screen: NEGATIVE

## 2022-04-04 LAB — PROTEIN / CREATININE RATIO, URINE
Creatinine, Urine: 90 mg/dL
Protein Creatinine Ratio: 0.18 mg/mg{Cre} — ABNORMAL HIGH (ref 0.00–0.15)
Total Protein, Urine: 16 mg/dL

## 2022-04-04 MED ORDER — ONDANSETRON HCL 4 MG/2ML IJ SOLN: 4.0000 mg | Freq: Four times a day (QID) | INTRAMUSCULAR | Status: DC | PRN

## 2022-04-04 MED ORDER — OXYTOCIN-SODIUM CHLORIDE 30-0.9 UT/500ML-% IV SOLN
2.5000 [IU]/h | INTRAVENOUS | Status: DC
  Filled 2022-04-04: qty 500

## 2022-04-04 MED ORDER — PHENYLEPHRINE 80 MCG/ML (10ML) SYRINGE FOR IV PUSH (FOR BLOOD PRESSURE SUPPORT): 80.0000 ug | PREFILLED_SYRINGE | INTRAVENOUS | Status: DC | PRN

## 2022-04-04 MED ORDER — LACTATED RINGERS IV SOLN: INTRAVENOUS | Status: DC

## 2022-04-04 MED ORDER — LABETALOL HCL 5 MG/ML IV SOLN: 20.0000 mg | INTRAVENOUS | Status: DC | PRN

## 2022-04-04 MED ORDER — ACETAMINOPHEN 325 MG PO TABS
650.0000 mg | ORAL_TABLET | ORAL | Status: DC | PRN
  Administered 2022-04-04: 650 mg via ORAL
  Filled 2022-04-04: qty 2

## 2022-04-04 MED ORDER — LIDOCAINE-EPINEPHRINE (PF) 2 %-1:200000 IJ SOLN
INTRAMUSCULAR | Status: DC | PRN
  Administered 2022-04-04: 5 mL via EPIDURAL

## 2022-04-04 MED ORDER — FENTANYL-BUPIVACAINE-NACL 0.5-0.125-0.9 MG/250ML-% EP SOLN
12.0000 mL/h | EPIDURAL | Status: DC | PRN
  Administered 2022-04-04: 12 mL/h via EPIDURAL
  Filled 2022-04-04: qty 250

## 2022-04-04 MED ORDER — LACTATED RINGERS IV SOLN
500.0000 mL | INTRAVENOUS | Status: DC | PRN
  Administered 2022-04-04: 500 mL via INTRAVENOUS

## 2022-04-04 MED ORDER — EPHEDRINE 5 MG/ML INJ: 10.0000 mg | INTRAVENOUS | Status: DC | PRN

## 2022-04-04 MED ORDER — LACTATED RINGERS IV SOLN
500.0000 mL | Freq: Once | INTRAVENOUS | Status: AC
  Administered 2022-04-04: 500 mL via INTRAVENOUS

## 2022-04-04 MED ORDER — LABETALOL HCL 5 MG/ML IV SOLN: 40.0000 mg | INTRAVENOUS | Status: DC | PRN

## 2022-04-04 MED ORDER — FENTANYL CITRATE (PF) 100 MCG/2ML IJ SOLN
100.0000 ug | INTRAMUSCULAR | Status: DC | PRN
  Administered 2022-04-04 (×2): 100 ug via INTRAVENOUS
  Filled 2022-04-04 (×2): qty 2

## 2022-04-04 MED ORDER — MISOPROSTOL 25 MCG QUARTER TABLET: 25.0000 ug | ORAL_TABLET | ORAL | Status: DC | PRN

## 2022-04-04 MED ORDER — LIDOCAINE HCL (PF) 1 % IJ SOLN: 30.0000 mL | INTRAMUSCULAR | Status: DC | PRN

## 2022-04-04 MED ORDER — MISOPROSTOL 25 MCG QUARTER TABLET
25.0000 ug | ORAL_TABLET | ORAL | Status: DC
  Administered 2022-04-04: 25 ug via VAGINAL
  Filled 2022-04-04: qty 1

## 2022-04-04 MED ORDER — SOD CITRATE-CITRIC ACID 500-334 MG/5ML PO SOLN: 30.0000 mL | ORAL | Status: DC | PRN

## 2022-04-04 MED ORDER — TERBUTALINE SULFATE 1 MG/ML IJ SOLN: 0.2500 mg | Freq: Once | INTRAMUSCULAR | Status: DC | PRN

## 2022-04-04 MED ORDER — DIPHENHYDRAMINE HCL 50 MG/ML IJ SOLN: 12.5000 mg | INTRAMUSCULAR | Status: DC | PRN

## 2022-04-04 MED ORDER — OXYTOCIN BOLUS FROM INFUSION
333.0000 mL | Freq: Once | INTRAVENOUS | Status: AC
  Administered 2022-04-05: 333 mL via INTRAVENOUS

## 2022-04-04 MED ORDER — HYDRALAZINE HCL 20 MG/ML IJ SOLN: 10.0000 mg | INTRAMUSCULAR | Status: DC | PRN

## 2022-04-04 MED ORDER — LABETALOL HCL 5 MG/ML IV SOLN: 80.0000 mg | INTRAVENOUS | Status: DC | PRN

## 2022-04-04 NOTE — Progress Notes (Signed)
LOW-RISK PREGNANCY VISIT Patient name: Cindy Randall MRN [id number]  Date of birth: 2000-10-25 Chief Complaint:   Routine Prenatal Visit  History of Present Illness:   PETA PEACHEY is a 21 y.o. G1P0 female at [redacted]w[redacted]d with an Estimated Date of Delivery: 04/10/22 being seen today for ongoing management of a low-risk pregnancy.      03/13/2022    3:06 PM 10/02/2021   11:19 AM  Depression screen PHQ 2/9  Decreased Interest 3 0  Down, Depressed, Hopeless 0 1  PHQ - 2 Score 3 1  Altered sleeping 1 1  Tired, decreased energy 3 0  Change in appetite 0 0  Feeling bad or failure about yourself  0 0  Trouble concentrating 0 0  Moving slowly or fidgety/restless 0 0  Suicidal thoughts 0 0  PHQ-9 Score 7 2    Today she reports  noted a headache yesterday that has since resolved.  Denies headache currently, no blurry vision. . Contractions: Not present. Vag. Bleeding: None.  Movement: Present. denies leaking of fluid. Review of Systems:   Pertinent items are noted in HPI Denies abnormal vaginal discharge w/ itching/odor/irritation, shortness of breath, chest pain, abdominal pain, severe nausea/vomiting, or problems with urination or bowel movements unless otherwise stated above. Pertinent History Reviewed:  Reviewed past medical,surgical, social, obstetrical and family history.  Reviewed problem list, medications and allergies.  Physical Assessment:   Vitals:   04/04/22 1318 04/04/22 1322  BP: (!) 148/101 (!) 140/97  Pulse: (!) 109 98  Weight: 170 lb (77.1 kg)   Body mass index is 31.09 kg/m.        Physical Examination:   General appearance: Well appearing, and in no distress  Mental status: Alert, oriented to person, place, and time  Skin: Warm & dry  Respiratory: Normal respiratory effort, no distress  Abdomen: Soft, gravid, nontender  Pelvic: Cervical exam deferred checked yesterday- fingertip        Extremities: Edema: Trace= 1+ edema left leg  Psych:  mood and affect  appropriate  Fetal Status:     Movement: Present    [place] today: cephalic,posterior placenta gr 3,FHR 152 bpm,AFI 15.5 cm,RI .54,.65,.63,.63=87%,BPP 8/8,efw 2583 g 2.8%,AC.5%   Chaperone: n/a    Results for orders placed or performed in visit on 04/04/22 (from the past 24 hour(s))  POC Urinalysis Dipstick OB   Collection Time: 04/04/22  1:21 PM  Result Value Ref Range   Color, UA     Clarity, UA     Glucose, UA Negative Negative   Bilirubin, UA     Ketones, UA neg    Spec Grav, UA     Blood, UA neg    pH, UA     POC,PROTEIN,UA Small (1+) Negative, Trace, Small (1+), Moderate (2+), Large (3+), 4+   Urobilinogen, UA     Nitrite, UA neg    Leukocytes, UA Negative Negative   Appearance     Odor       Assessment & Plan:  1) Low-risk pregnancy G1P0 at [redacted]w[redacted]d with an Estimated Date of Delivery: 04/10/22   2) FGR -ultrasound today with severe growth restriction, FGR 2.8%  3) Gestational HTN -discussed plan to proceed with IOL today -will plan for preeclampsia work up at hospital -questions/concerns were addressed -hospital notified  Meds: No orders of the defined types were placed in this encounter.  Labs/procedures today: growth scan today as above  Plan:  pt to go to L&D   Follow-up: Return for  direct admit to L&D, return in 1 wk for BP check.  Orders Placed This Encounter  Procedures   POC Urinalysis Dipstick OB    Myna Hidalgo, DO Attending Obstetrician & Gynecologist, [facility] for [employer], [facility]

## 2022-04-04 NOTE — Progress Notes (Signed)
[place] 74+1 wks,cephalic,posterior placenta gr 3,FHR 152 bpm,AFI 15.5 cm,RI .54,.65,.63,.63=87%,BPP 8/8,efw 2583 g 2.8%,AC.5%

## 2022-04-04 NOTE — Anesthesia Procedure Notes (Signed)
Epidural Patient location during procedure: OB Start time: 04/04/2022 11:40 PM End time: 04/04/2022 11:50 PM  Staffing Anesthesiologist: Freddrick March, MD Performed: anesthesiologist   Preanesthetic Checklist Completed: patient identified, IV checked, risks and benefits discussed, monitors and equipment checked, pre-op evaluation and timeout performed  Epidural Patient position: sitting Prep: DuraPrep and site prepped and draped Patient monitoring: continuous pulse ox, blood pressure, heart rate and cardiac monitor Approach: midline Location: L3-L4 Injection technique: LOR air  Needle:  Needle type: Tuohy  Needle gauge: 17 G Needle length: 9 cm Needle insertion depth: 5 cm Catheter type: closed end flexible Catheter size: 19 Gauge Catheter at skin depth: 10 cm Test dose: negative  Assessment Sensory level: T8 Events: blood not aspirated, injection not painful, no injection resistance, no paresthesia and negative IV test  Additional Notes Patient identified. Risks/Benefits/Options discussed with patient including but not limited to bleeding, infection, nerve damage, paralysis, failed block, incomplete pain control, headache, blood pressure changes, nausea, vomiting, reactions to medication both or allergic, itching and postpartum back pain. Confirmed with bedside nurse the patient's most recent platelet count. Confirmed with patient that they are not currently taking any anticoagulation, have any bleeding history or any family history of bleeding disorders. Patient expressed understanding and wished to proceed. All questions were answered. Sterile technique was used throughout the entire procedure. Please see nursing notes for vital signs. Test dose was given through epidural catheter and negative prior to continuing to dose epidural or start infusion. Warning signs of high block given to the patient including shortness of breath, tingling/numbness in hands, complete motor block,  or any concerning symptoms with instructions to call for help. Patient was given instructions on fall risk and not to get out of bed. All questions and concerns addressed with instructions to call with any issues or inadequate analgesia.  Reason for block:procedure for pain

## 2022-04-04 NOTE — H&P (Addendum)
OBSTETRIC ADMISSION HISTORY AND PHYSICAL  Cindy Randall is a 21 y.o. female G1P0 with IUP at [redacted]w[redacted]d by [redacted]w[redacted]d ultrasound presenting for IOL in the setting of FGR and gHTN. She reports +FMs, No LOF, no VB, no blurry vision, headaches or peripheral edema, and RUQ pain.  She plans on breast and bottle feeding. She request depo for birth control. She received her prenatal care at [facility]   Dating: By [redacted]w[redacted]d --->  Estimated Date of Delivery: 04/10/22  Sono:    @[redacted]w[redacted]d , CWD, normal anatomy, cephalic presentation, posterior lie, 661g, 46% EFW   Prenatal History/Complications:   FGR 9.3% EFW as of 11/3 gHTN as of 11/3    Past Medical History: Past Medical History:  Diagnosis Date   Medical history non-contributory     Past Surgical History: Past Surgical History:  Procedure Laterality Date   NO PAST SURGERIES     ORIF ANKLE FRACTURE Left 02/07/2021   Procedure: OPEN REDUCTION INTERNAL FIXATION (ORIF) ANKLE FRACTURE;  Surgeon: Mordecai Rasmussen, MD;  Location: [facility];  Service: Orthopedics;  Laterality: Left;  Bimalleolar ankle fracture    Obstetrical History: OB History     Gravida  1   Para      Term      Preterm      AB      Living         SAB      IAB      Ectopic      Multiple      Live Births              Social History Social History   Socioeconomic History   Marital status: Single    Spouse name: Not on file   Number of children: Not on file   Years of education: Not on file   Highest education level: Not on file  Occupational History   Not on file  Tobacco Use   Smoking status: Never    Passive exposure: Yes   Smokeless tobacco: Never  Vaping Use   Vaping Use: Never used  Substance and Sexual Activity   Alcohol use: No   Drug use: Not Currently    Types: Marijuana   Sexual activity: Yes    Birth control/protection: None  Other Topics Concern   Not on file  Social History Narrative   Not on file   Social Determinants of Health    Financial Resource Strain: Low Risk  (10/02/2021)   Overall Financial Resource Strain (CARDIA)    Difficulty of Paying Living Expenses: Not hard at all  Food Insecurity: No Food Insecurity (04/04/2022)   Hunger Vital Sign    Worried About Running Out of Food in the Last Year: Never true    Ran Out of Food in the Last Year: Never true  Transportation Needs: No Transportation Needs (04/04/2022)   PRAPARE - Hydrologist (Medical): No    Lack of Transportation (Non-Medical): No  Physical Activity: Inactive (10/02/2021)   Exercise Vital Sign    Days of Exercise per Week: 0 days    Minutes of Exercise per Session: 0 min  Stress: No Stress Concern Present (10/02/2021)   [place]    Feeling of Stress : Only a little  Social Connections: Moderately Integrated (10/02/2021)   Social Connection and Isolation Panel [NHANES]    Frequency of Communication with Friends and Family: More than three times a week  Frequency of Social Gatherings with Friends and Family: More than three times a week    Attends Religious Services: 1 to 4 times per year    Active Member of [employer] or Organizations: No    Attends Music therapist: Never    Marital Status: Living with partner    Family History: Family History  Problem Relation Age of Onset   Graves' disease Mother     Allergies: No Known Allergies  Medications Prior to Admission  Medication Sig Dispense Refill Last Dose   Prenatal MV & Min w/FA-DHA (PRENATAL GUMMIES PO) Take by mouth.   Past Month   aspirin EC 81 MG tablet Take 1 tablet (81 mg total) by mouth daily. Swallow whole. (Patient not taking: Reported on 12/02/2021) 30 tablet 11    metroNIDAZOLE (FLAGYL) 500 MG tablet Take 1 tablet (500 mg total) by mouth 2 (two) times daily. (Patient not taking: Reported on 03/27/2022) 14 tablet 0    terconazole (TERAZOL 7) 0.4 % vaginal cream Place 1  applicator vaginally at bedtime. 45 g 0      Review of Systems   All systems reviewed and negative except as stated in HPI  Blood pressure (!) 136/96, pulse 94, temperature 98 F (36.7 C), temperature source Oral, resp. rate 18, height 5\' 2"  (1.575 m), weight 77.8 kg, last menstrual period 06/22/2021. General appearance: alert and cooperative Lungs: clear to auscultation bilaterally Heart: regular rate and rhythm Abdomen: soft, non-tender; bowel sounds normal Extremities: Homans sign is negative, no sign of DVT Presentation: cephalic Fetal monitoring Baseline 155 bpm, moderate variability, + accels, - decels  Uterine activity Uterine irritability Dilation: 1 Effacement (%): 50 Station: -3 Exam by:: Dr. Janus Molder   Prenatal labs: ABO, Rh: --/--/B POS (11/03 1627) Antibody: NEG (11/03 1627) Rubella: 4.20 (05/09 1212) RPR: Non Reactive (08/17 0833)  HBsAg: Negative (05/09 1212)  HIV: Non Reactive (08/17 0833)  GBS: Negative/-- (10/12 1633)  1 hr Glucola 147 Genetic screening  low risk female Anatomy US wnl   Prenatal Transfer Tool  Maternal Diabetes: No Genetic Screening: Normal Maternal Ultrasounds/Referrals: IUGR Fetal Ultrasounds or other Referrals:  None Maternal Substance Abuse:  No Significant Maternal Medications:  None Significant Maternal Lab Results:  Group B Strep negative Number of Prenatal Visits:greater than 3 verified prenatal visits Other Comments:  None  Results for orders placed or performed during the hospital encounter of 04/04/22 (from the past 24 hour(s))  CBC   Collection Time: 04/04/22  4:23 PM  Result Value Ref Range   WBC 7.7 4.0 - 10.5 K/uL   RBC 3.38 (L) 3.87 - 5.11 MIL/uL   Hemoglobin 10.9 (L) 12.0 - 15.0 g/dL   HCT 32.7 (L) 36.0 - 46.0 %   MCV 96.7 80.0 - 100.0 fL   MCH 32.2 26.0 - 34.0 pg   MCHC 33.3 30.0 - 36.0 g/dL   RDW 15.7 (H) 11.5 - 15.5 %   Platelets 159 150 - 400 K/uL   nRBC 0.0 0.0 - 0.2 %  Comprehensive metabolic panel    Collection Time: 04/04/22  4:23 PM  Result Value Ref Range   Sodium 137 135 - 145 mmol/L   Potassium 3.9 3.5 - 5.1 mmol/L   Chloride 108 98 - 111 mmol/L   CO2 22 22 - 32 mmol/L   Glucose, Bld 101 (H) 70 - 99 mg/dL   BUN 10 6 - 20 mg/dL   Creatinine, Ser 0.83 0.44 - 1.00 mg/dL   Calcium 9.1 8.9 -  10.3 mg/dL   Total Protein 6.1 (L) 6.5 - 8.1 g/dL   Albumin 2.7 (L) 3.5 - 5.0 g/dL   AST 22 15 - 41 U/L   ALT 14 0 - 44 U/L   Alkaline Phosphatase 165 (H) 38 - 126 U/L   Total Bilirubin 0.3 0.3 - 1.2 mg/dL   GFR, Estimated >60 >60 mL/min   Anion gap 7 5 - 15  Type and screen [place]   Collection Time: 04/04/22  4:27 PM  Result Value Ref Range   ABO/RH(D) B POS    Antibody Screen NEG    Sample Expiration      04/07/2022,2359 Performed at [facility], [place] [address] [place] [address]   Protein / creatinine ratio, urine   Collection Time: 04/04/22  5:50 PM  Result Value Ref Range   Creatinine, Urine 90 mg/dL   Total Protein, Urine 16 mg/dL   Protein Creatinine Ratio 0.18 (H) 0.00 - 0.15 mg/mg[Cre]  Results for orders placed or performed in visit on 04/04/22 (from the past 24 hour(s))  POC Urinalysis Dipstick OB   Collection Time: 04/04/22  1:21 PM  Result Value Ref Range   Color, UA     Clarity, UA     Glucose, UA Negative Negative   Bilirubin, UA     Ketones, UA neg    Spec Grav, UA     Blood, UA neg    pH, UA     POC,PROTEIN,UA Small (1+) Negative, Trace, Small (1+), Moderate (2+), Large (3+), 4+   Urobilinogen, UA     Nitrite, UA neg    Leukocytes, UA Negative Negative   Appearance     Odor      Patient Active Problem List   Diagnosis Date Noted   Indication for care in labor and delivery, antepartum 04/04/2022   Trichomonas infection 03/20/2022   Supervision of normal first pregnancy 10/01/2021    Assessment/Plan:  Cindy Randall is a 21 y.o. G1P0 at [redacted]w[redacted]d here for IOL s/t FGR (EFW 2.8%) and gHTN.   #Labor: Attempted  FB, unsuccessful, starting with one cytotec 25 vaginally due to FGR, continue to monitor  #Pain: Plans to attempt without analgesia open to epidural  #FWB: Cat 1  #ID:  GBS negative  #MOF: Both  #MOC:Depo  #Circ:  Yes  Lowry Ram, MD  04/04/2022, 7:17 PM  GME ATTESTATION:  I saw and evaluated the patient. I agree with the findings and the plan of care as documented in the resident's note. I have made changes to documentation as necessary.  Gerlene Fee, DO OB Fellow, [place] for [place] 04/04/2022, 8:01 PM

## 2022-04-04 NOTE — Anesthesia Preprocedure Evaluation (Signed)
Anesthesia Evaluation  Patient identified by MRN, date of birth, ID band Patient awake    Reviewed: Allergy & Precautions, NPO status , Patient's Chart, lab work & pertinent test results  Airway Mallampati: II  TM Distance: >3 FB Neck ROM: Full    Dental no notable dental hx.    Pulmonary neg pulmonary ROS   Pulmonary exam normal breath sounds clear to auscultation       Cardiovascular hypertension (gHTN), Normal cardiovascular exam Rhythm:Regular Rate:Normal     Neuro/Psych negative neurological ROS  negative psych ROS   GI/Hepatic negative GI ROS, Neg liver ROS,,,  Endo/Other  negative endocrine ROS    Renal/GU negative Renal ROS  negative genitourinary   Musculoskeletal negative musculoskeletal ROS (+)    Abdominal   Peds  Hematology negative hematology ROS (+)   Anesthesia Other Findings Induction of labor due to FGR and gHTN  Reproductive/Obstetrics (+) Pregnancy                             Anesthesia Physical Anesthesia Plan  ASA: 3  Anesthesia Plan: Epidural   Post-op Pain Management:    Induction:   PONV Risk Score and Plan: Treatment may vary due to age or medical condition  Airway Management Planned: Natural Airway  Additional Equipment:   Intra-op Plan:   Post-operative Plan:   Informed Consent: I have reviewed the patients History and Physical, chart, labs and discussed the procedure including the risks, benefits and alternatives for the proposed anesthesia with the patient or authorized representative who has indicated his/her understanding and acceptance.       Plan Discussed with: Anesthesiologist  Anesthesia Plan Comments: (Patient identified. Risks, benefits, options discussed with patient including but not limited to bleeding, infection, nerve damage, paralysis, failed block, incomplete pain control, headache, blood pressure changes, nausea, vomiting,  reactions to medication, itching, and post partum back pain. Confirmed with bedside nurse the patient's most recent platelet count. Confirmed with the patient that they are not taking any anticoagulation, have any bleeding history or any family history of bleeding disorders. Patient expressed understanding and wishes to proceed. All questions were answered. )       Anesthesia Quick Evaluation

## 2022-04-04 NOTE — Progress Notes (Addendum)
Cindy Randall is a 21 y.o. G1P0 at [redacted]w[redacted]d by ultrasound admitted for induction of labor due to [place] 2.8% and gHTN. She reports +FMs, No LOF, no VB, no blurry vision, headaches or peripheral edema, and RUQ pain.  She plans on breast and bottle feeding. She request depo for birth control. She received her prenatal care at [facility]   Subjective: No acute concerns. S/P cytotec x 1 at [place].  Objective: BP (!) 136/96   Pulse 94   Temp 97.9 F (36.6 C) (Oral)   Resp 18   Ht 5\' 2"  (1.575 m)   Wt 77.8 kg   LMP 06/22/2021   BMI 31.37 kg/m  No intake/output data recorded. No intake/output data recorded.  FHT:  FHR: 150 bpm, variability: moderate,  accelerations:  Present,  decelerations:  Absent UC:   irregular, 3-5 mins SVE:   Dilation: 1 Effacement (%): 50 Station: -3 Exam by:: Dr. Janus Molder  Labs: Lab Results  Component Value Date   WBC 7.7 04/04/2022   HGB 10.9 (L) 04/04/2022   HCT 32.7 (L) 04/04/2022   MCV 96.7 04/04/2022   PLT 159 04/04/2022   Assessment / Plan: Induction of labor due to FGR and gHTN  Labor: Progressing normally Fetal Wellbeing:  Category I Pain Control:  IV pain meds Anticipated MOD:  NSVD  FGR -ultrasound today with severe growth restriction, FGR 2.8%   Gestational HTN -discussed plan to proceed with IOL; recheck cx 4hrs after cytotec dose to determine next steps -pre-e labs neg -questions/concerns were addressed  Shirlyn Goltz, Medical Student 04/04/2022, 10:19 PM  Attestation of Supervision of Student:  I confirm that I have verified the information documented in the medical student's note and that I have also personally reperformed the history, physical exam and all medical decision making activities.  I have verified that all services and findings are accurately documented in this student's note; and I agree with management and plan as outlined in the documentation. I have also made any necessary editorial changes.   Myrtis Ser,  [place] for [employer], [place] [employer] 04/04/2022 10:57 PM

## 2022-04-05 ENCOUNTER — Encounter (HOSPITAL_COMMUNITY): Payer: Self-pay | Admitting: Obstetrics and Gynecology

## 2022-04-05 DIAGNOSIS — O365930 Maternal care for other known or suspected poor fetal growth, third trimester, not applicable or unspecified: Secondary | ICD-10-CM

## 2022-04-05 DIAGNOSIS — O134 Gestational [pregnancy-induced] hypertension without significant proteinuria, complicating childbirth: Secondary | ICD-10-CM

## 2022-04-05 DIAGNOSIS — Z3A39 39 weeks gestation of pregnancy: Secondary | ICD-10-CM

## 2022-04-05 LAB — RPR: RPR Ser Ql: NONREACTIVE

## 2022-04-05 MED ORDER — ACETAMINOPHEN 325 MG PO TABS: 650.0000 mg | ORAL_TABLET | ORAL | Status: DC | PRN

## 2022-04-05 MED ORDER — COCONUT OIL OIL: 1.0000 | TOPICAL_OIL | Status: DC | PRN

## 2022-04-05 MED ORDER — ONDANSETRON HCL 4 MG/2ML IJ SOLN: 4.0000 mg | INTRAMUSCULAR | Status: DC | PRN

## 2022-04-05 MED ORDER — PRENATAL MULTIVITAMIN CH
1.0000 | ORAL_TABLET | Freq: Every day | ORAL | Status: DC
  Administered 2022-04-05 – 2022-04-07 (×3): 1 via ORAL
  Filled 2022-04-05 (×3): qty 1

## 2022-04-05 MED ORDER — NIFEDIPINE ER OSMOTIC RELEASE 30 MG PO TB24
30.0000 mg | ORAL_TABLET | Freq: Every day | ORAL | Status: DC
  Administered 2022-04-05 – 2022-04-07 (×3): 30 mg via ORAL
  Filled 2022-04-05 (×3): qty 1

## 2022-04-05 MED ORDER — OXYCODONE HCL 5 MG PO TABS: 5.0000 mg | ORAL_TABLET | ORAL | Status: DC | PRN

## 2022-04-05 MED ORDER — FUROSEMIDE 20 MG PO TABS
20.0000 mg | ORAL_TABLET | Freq: Every day | ORAL | Status: DC
  Administered 2022-04-05 – 2022-04-07 (×3): 20 mg via ORAL
  Filled 2022-04-05 (×3): qty 1

## 2022-04-05 MED ORDER — DIBUCAINE (PERIANAL) 1 % EX OINT: 1.0000 | TOPICAL_OINTMENT | CUTANEOUS | Status: DC | PRN

## 2022-04-05 MED ORDER — IBUPROFEN 600 MG PO TABS
600.0000 mg | ORAL_TABLET | Freq: Four times a day (QID) | ORAL | Status: DC
  Administered 2022-04-05 – 2022-04-07 (×8): 600 mg via ORAL
  Filled 2022-04-05 (×10): qty 1

## 2022-04-05 MED ORDER — ONDANSETRON HCL 4 MG PO TABS: 4.0000 mg | ORAL_TABLET | ORAL | Status: DC | PRN

## 2022-04-05 MED ORDER — MEASLES, MUMPS & RUBELLA VAC IJ SOLR: 0.5000 mL | Freq: Once | INTRAMUSCULAR | Status: DC

## 2022-04-05 MED ORDER — MEDROXYPROGESTERONE ACETATE 150 MG/ML IM SUSP
150.0000 mg | Freq: Once | INTRAMUSCULAR | Status: AC
  Administered 2022-04-06: 150 mg via INTRAMUSCULAR
  Filled 2022-04-05 (×2): qty 1

## 2022-04-05 MED ORDER — BENZOCAINE-MENTHOL 20-0.5 % EX AERO
1.0000 | INHALATION_SPRAY | CUTANEOUS | Status: DC | PRN
  Filled 2022-04-05: qty 56

## 2022-04-05 MED ORDER — ZOLPIDEM TARTRATE 5 MG PO TABS: 5.0000 mg | ORAL_TABLET | Freq: Every evening | ORAL | Status: DC | PRN

## 2022-04-05 MED ORDER — DIPHENHYDRAMINE HCL 25 MG PO CAPS: 25.0000 mg | ORAL_CAPSULE | Freq: Four times a day (QID) | ORAL | Status: DC | PRN

## 2022-04-05 MED ORDER — WITCH HAZEL-GLYCERIN EX PADS: 1.0000 | MEDICATED_PAD | CUTANEOUS | Status: DC | PRN

## 2022-04-05 MED ORDER — TETANUS-DIPHTH-ACELL PERTUSSIS 5-2.5-18.5 LF-MCG/0.5 IM SUSY: 0.5000 mL | PREFILLED_SYRINGE | Freq: Once | INTRAMUSCULAR | Status: DC

## 2022-04-05 MED ORDER — SIMETHICONE 80 MG PO CHEW: 80.0000 mg | CHEWABLE_TABLET | ORAL | Status: DC | PRN

## 2022-04-05 MED ORDER — SENNOSIDES-DOCUSATE SODIUM 8.6-50 MG PO TABS
2.0000 | ORAL_TABLET | ORAL | Status: DC
  Administered 2022-04-05: 2 via ORAL
  Filled 2022-04-05 (×5): qty 2

## 2022-04-05 NOTE — Lactation Note (Signed)
This note was copied from a baby's chart. Lactation Consultation Note  Patient Name: Cindy Randall [id number] Date: 04/05/2022 Age:22 hours  LC in to room for initial consult. Birthing parent declines lactation visit, she is formula feeding exclusively.    Feeding Mother's Current Feeding Choice: Formula  Consult Status Consult Status: Complete    Cindy Randall 04/05/2022, 9:10 AM

## 2022-04-05 NOTE — Discharge Summary (Signed)
Postpartum Discharge Summary  Date of Service updated***     Patient Name: Cindy Randall DOB: [birth year] MRN: [id number]  Date of admission: 04/04/2022 Delivery date:04/05/2022  Delivering provider: Serita Grammes D  Date of discharge: 04/05/2022  Admitting diagnosis: Indication for care in labor and delivery, antepartum [O75.9] Intrauterine pregnancy: [redacted]w[redacted]d    Secondary diagnosis:  Principal Problem:   Indication for care in labor and delivery, antepartum Active Problems:   Fetal growth restriction antepartum   Gestational hypertension  Additional problems: none    Discharge diagnosis: Term Pregnancy Delivered and Gestational Hypertension                                              Post partum procedures:{Postpartum procedures:23558} Augmentation: AROM and Cytotec Complications: None  Hospital course: Induction of Labor With Vaginal Delivery   21y.o. yo G1P0 at 345w2das admitted to the hospital 04/04/2022 for induction of labor.  Indication for induction:  new onset gHTN and FGR (EFW 2.8%) .  Patient had an labor course complicated by being asymptomatic for gHTN with neg pre-e labs and mild range elevations. Membrane Rupture Time/Date: 12:15 AM ,04/05/2022   Delivery Method:Vaginal, Spontaneous  Episiotomy: None  Lacerations:  None;1st degree;Perineal  Details of delivery can be found in separate delivery note.  Patient had a postpartum course remarkable for being started on Lasix 2056m 5d; her BPs remained ***. Patient is discharged home 04/05/22.  Newborn Data: Birth date:04/05/2022  Birth time:2:24 AM  Gender:Female  Living status:Living  Apgars:9 ,9  [id number] [phone](5lb 15oz)  Magnesium Sulfate received: No BMZ received: No Rhophylac:N/A MMR:N/A T-DaP:Given prenatally Flu: Yes Transfusion:{Transfusion received:30440034}  Physical exam  Vitals:   04/05/22 0330 04/05/22 0345 04/05/22 0440 04/05/22 0546  BP: (!) 136/94 123/89 (!) 145/84 (!) 129/90  Pulse:  100 97 94 (!) 102  Resp:   18 16  Temp:   98.4 F (36.9 C) 98 F (36.7 C)  TempSrc:   Oral Axillary  SpO2:   100% 100%  Weight:      Height:       General: {Exam; general:21111117} Lochia: {Desc; appropriate/inappropriate:30686::"appropriate"} Uterine Fundus: {Desc; firm/soft:30687} Incision: {Exam; incision:21111123} DVT Evaluation: {Exam; dvt:2111122} Labs: Lab Results  Component Value Date   WBC 12.2 (H) 04/04/2022   HGB 11.1 (L) 04/04/2022   HCT 32.4 (L) 04/04/2022   MCV 95.0 04/04/2022   PLT 166 04/04/2022      Latest Ref Rng & Units 04/04/2022    4:23 PM  CMP  Glucose 70 - 99 mg/dL 101   BUN 6 - 20 mg/dL 10   Creatinine 0.44 - 1.00 mg/dL 0.83   Sodium 135 - 145 mmol/L 137   Potassium 3.5 - 5.1 mmol/L 3.9   Chloride 98 - 111 mmol/L 108   CO2 22 - 32 mmol/L 22   Calcium 8.9 - 10.3 mg/dL 9.1   Total Protein 6.5 - 8.1 g/dL 6.1   Total Bilirubin 0.3 - 1.2 mg/dL 0.3   Alkaline Phos 38 - 126 U/L 165   AST 15 - 41 U/L 22   ALT 0 - 44 U/L 14    Edinburgh Score:    04/05/2022    4:40 AM  Edinburgh Postnatal Depression Scale Screening Tool  I have been able to laugh and see the funny side of things. 0  I  have looked forward with enjoyment to things. 0  I have blamed myself unnecessarily when things went wrong. 1  I have been anxious or worried for no good reason. 0  I have felt scared or panicky for no good reason. 1  Things have been getting on top of me. 1  I have been so unhappy that I have had difficulty sleeping. 0  I have felt sad or miserable. 0  I have been so unhappy that I have been crying. 0  The thought of harming myself has occurred to me. 0  Edinburgh Postnatal Depression Scale Total 3     After visit meds:  Allergies as of 04/05/2022   No Known Allergies   Med Rec must be completed prior to using this [facility]***        Discharge home in stable condition Infant Feeding: {Baby feeding:23562} Infant Disposition:{CHL IP OB HOME WITH  QXAFHS:30746} Discharge instruction: per After Visit Summary and Postpartum booklet. Activity: Advance as tolerated. Pelvic rest for 6 weeks.  Diet: routine diet Future Appointments: Future Appointments  Date Time Provider [place]  04/10/2022 10:10 AM [facility] NURSE [facility]  04/10/2022 10:30 AM Cresenzo-Dishmon, Joaquim Lai, CNM [facility]   Follow up Visit:  Myrtis Ser, CNM  Meredeth Ide R Please schedule this patient for Postpartum visit in: 6 weeks with the following provider: Any provider In-Person For C/S patients schedule nurse incision check in weeks 2 weeks: no High risk pregnancy complicated by: gHTN Delivery mode:  SVD Anticipated Birth Control:   PP Depo given PP Procedures needed: BP check in 1wk- RN visit Schedule Integrated [place] visit: no   04/05/2022 Myrtis Ser, CNM

## 2022-04-05 NOTE — Progress Notes (Signed)
Pt's BPs 129/94 at 2031, pt went to NICU to see baby, rechecked BP after pt's return at 2307, BP 140/97. Pt asymptomatic, had procardia ~1800. Dr. Charleston Ropes called, no new orders at this time. Will continue to monitor.

## 2022-04-05 NOTE — Progress Notes (Signed)
Cindy Randall is a 21 y.o. G1P0 at [redacted]w[redacted]d by ultrasound admitted for induction of labor due to [place] 2.8% and gHTN. She reports +FMs, No LOF, no VB, no blurry vision, headaches or peripheral edema, and RUQ pain.  She plans on breast and bottle feeding. She request depo for birth control. She received her prenatal [facility] at [facility]  Subjective: No acute concerns. Pain well controlled on epidural. AROM at 0020  Objective: BP (!) 127/101   Pulse 90   Temp 98.2 F (36.8 C) (Axillary)   Resp 18   Ht 5\' 2"  (1.575 m)   Wt 77.8 kg   LMP 06/22/2021   SpO2 99%   BMI 31.37 kg/m  No intake/output data recorded. No intake/output data recorded.  FHT:  FHR: 140 bpm, variability: moderate,  accelerations:  Present,  decelerations:  Absent UC:   regular, every 1.5-3 minutes SVE:   Dilation: 10 Effacement (%): 100 Station: Plus 1 Exam by:: [employer] CNM  Labs: Lab Results  Component Value Date   WBC 12.2 (H) 04/04/2022   HGB 11.1 (L) 04/04/2022   HCT 32.4 (L) 04/04/2022   MCV 95.0 04/04/2022   PLT 166 04/04/2022    Assessment / Plan: Induction of labor due to FGR, gHTN  Labor: Progressing normally, AROM at 0020 Fetal Wellbeing:  Category I Pain Control:  Epidural and IV pain medsX1 Anticipated MOD:  NSVD  FGR -ultrasound today with severe growth restriction, FGR 2.8%   Gestational HTN -pre-e labs neg -questions/concerns were addressed  Shirlyn Goltz, Medical Student 04/05/2022, 12:26 AM

## 2022-04-06 NOTE — Progress Notes (Signed)
Post Partum Day 1 Subjective: no complaints, voiding, tolerating PO, + flatus, and has passed a BM . Pt has been ambulating without issues.  Objective: Blood pressure 130/88, pulse 93, temperature 98.2 F (36.8 C), temperature source Oral, resp. rate 18, height 5\' 2"  (1.575 m), weight 77.8 kg, last menstrual period 06/22/2021, SpO2 100 %, unknown if currently breastfeeding.  Physical Exam:  General: alert and no distress Lochia: appropriate Uterine Fundus: firm DVT Evaluation: No evidence of DVT seen on physical exam. No significant calf/ankle edema.  Recent Labs    04/04/22 1623 04/04/22 2307  HGB 10.9* 11.1*  HCT 32.7* 32.4*    Assessment/Plan: Contraception Depo given  Pt doing well. Baby in NICU. She has gone down to see baby and plans to go again this morning.  FOB at bedside.  PP HTN Pt with a BP of 140/97 at 2307 given Procardia at 1800. Continue to monitor. BP currently at 130/88 second dose of Lasix 20mg  and Procardia set for 1000.   LOS: 2 days   Shirlyn Goltz, Medical Student 04/06/2022, 7:41 AM

## 2022-04-06 NOTE — Clinical Social Work Maternal (Signed)
CLINICAL SOCIAL WORK MATERNAL/CHILD NOTE  Patient Details  Name: Cindy Randall MRN: [id number] Date of Birth: 09/09/2000  Date:  04/06/2022  Clinical Social Worker Initiating Note:  Idamae Lusher, LCSWA Date/Time: Initiated:  04/06/22/1521     Child's Name:  Cindy Randall   Biological Parents:  Mother, Father (FOB: Hetty Blend Randall DOB: 11/06/2000)   Need for Interpreter:  None   Reason for Referral:  Current Substance Use/Substance Use During Pregnancy     Address:  [address] [place], [place] [address]   Phone number:  [phone] (home)     Additional phone number:   Household Members/Support Persons (HM/SP):   Household Member/Support Person 1, Household Member/Support Person 2, Household Member/Support Person 3   HM/SP Name Relationship DOB or Age  HM/SP -1 Caleb Randall FOB 11/06/2000  HM/SP -2   FOB's mother    HM/SP -3   FOB's sister    HM/SP -4        HM/SP -5        HM/SP -6        HM/SP -7        HM/SP -8          Natural Supports (not living in the home):  Immediate Family   Professional Supports: None   Employment: Unemployed   Type of Work:     Education:  Programmer, systems   Homebound arranged:    Museum/gallery curator Resources:  [employer]   Other Resources:  [employer], Physicist, medical     Cultural/Religious Considerations Which May Impact Care:  None identified  Strengths:  Ability to meet basic needs  , Home prepared for child  , Pediatrician chosen   Psychotropic Medications:         Pediatrician:     [facility])  Pediatrician List:   [place]      Pediatrician Fax Number:    Risk Factors/Current Problems:  Substance Use     Cognitive State:  Able to Concentrate  , Alert  , Linear Thinking  , Goal Oriented     Mood/Affect:  Relaxed  , Interested  , Comfortable  , Calm  , Happy     CSW Assessment: CSW was consulted due to history of  THC use. CSW met with MOB at bedside to complete assessment. When CSW entered room, MOB was observed sitting in hospital bed. FOB was present sitting nearby. Infant "Caseyon" was not present due to being admitted to NICU. CSW introduced self and requested to speak with MOB alone. MOB provided verbal consent to complete consult with FOB present. CSW explained reason for consult. MOB was agreeable to consult, presented as calm, and remained engaged throughout encounter.   CSW inquired how MOB has felt emotionally since giving birth. MOB reports she is feeling "good." CSW discussed infant's NICU admission and inquired if MOB is feeling well informed about infant's care. MOB reports she is feeling well informed and is anticipating that infant will be brought back down to her room this afternoon if his glucose levels come back normal. CSW inquired about MOB's mental health history. MOB denies a history of mental health symptoms/diagnoses. MOB reports she has never been prescribed psychotropic medication or attended therapy. MOB denied current SI/HI. MOB identified FOB, her family and friends as supports. CSW provided education regarding the baby blues period vs. perinatal mood  disorders, discussed treatment and gave resources for mental health follow up if concerns arise.  CSW recommends self-evaluation during the postpartum time period using the New Mom Checklist from Postpartum Progress and encouraged MOB to contact a medical professional if symptoms are noted at any time.   CSW informed MOB about hospital drug screen policy due to MOB's history of THC use. CSW explained that infant's UDS was negative for illegal substances and CDS would be monitored and a CPS report would be made if warranted. MOB verbalized understanding. CSW inquired about substance use care during pregnancy. MOB reports she smoked marijuana prior to knowing that she was pregnant but reports she stopped smoking marijuana once her pregnancy was  confirmed. MOB denied using other illicit substances during pregnancy.   MOB reports she has a car seat and bassinet for infant but has not yet put infant's crib together. CSW inquired if MOB is in need of a pack n play, MOB reported that she is. CSW provided MOB with a pack n play, MOB signed waiver. MOB reports she has all other needed items for infant. MOB has chosen [facility] for infant's follow up care. MOB denied transportation barriers. MOB reports she receives [facility] and food stamps. MOB declined additional resource needs at this time.   CSW provided review of Sudden Infant Death Syndrome (SIDS) precautions.    CSW identifies no further need for intervention and no barriers to discharge at this time. CSW will continue to monitor Umbilical Cord Tissue Drug Screen results and make report if warranted.  CSW Plan/Description:  No Further Intervention Required/No Barriers to Discharge, CSW Will Continue to Monitor Umbilical Cord Tissue Drug Screen Results and Make Report if [facility], [place], Perinatal Mood and Anxiety Disorder (PMADs) Education, Sudden Infant Death Syndrome (SIDS) Education    Kenna Gilbert Christine, Reynolds 04/06/2022, 3:25 PM

## 2022-04-06 NOTE — Anesthesia Postprocedure Evaluation (Signed)
Anesthesia Post Note  Patient: Cindy Randall  Procedure(s) Performed: AN AD HOC LABOR EPIDURAL     Patient location during evaluation: Mother Baby Anesthesia Type: Epidural Level of consciousness: awake, oriented and awake and alert Pain management: pain level controlled Vital Signs Assessment: post-procedure vital signs reviewed and stable Respiratory status: spontaneous breathing, respiratory function stable and nonlabored ventilation Cardiovascular status: stable Postop Assessment: no headache, adequate PO intake, patient able to bend at knees, able to ambulate and no apparent nausea or vomiting Anesthetic complications: no   No notable events documented.  Last Vitals:  Vitals:   04/05/22 2307 04/06/22 0700  BP: (!) 140/97 130/88  Pulse: 92 93  Resp:  18  Temp:  36.8 C  SpO2:  100%    Last Pain:  Vitals:   04/06/22 0755  TempSrc:   PainSc: 0-No pain   Pain Goal: Patients Stated Pain Goal: 2 (04/05/22 2307)                 Merrick Feutz

## 2022-04-07 ENCOUNTER — Other Ambulatory Visit (HOSPITAL_COMMUNITY): Payer: Self-pay

## 2022-04-07 LAB — CERVICOVAGINAL ANCILLARY ONLY
Comment: NEGATIVE
Trichomonas: NEGATIVE

## 2022-04-07 MED ORDER — IBUPROFEN 600 MG PO TABS
600.0000 mg | ORAL_TABLET | Freq: Four times a day (QID) | ORAL | 0 refills | Status: AC
  Filled 2022-04-07: qty 30, 8d supply, fill #0

## 2022-04-07 MED ORDER — NIFEDIPINE ER 30 MG PO TB24
30.0000 mg | ORAL_TABLET | Freq: Every day | ORAL | 0 refills | Status: AC
  Filled 2022-04-07: qty 30, 30d supply, fill #0

## 2022-04-07 MED ORDER — FUROSEMIDE 20 MG PO TABS
20.0000 mg | ORAL_TABLET | Freq: Every day | ORAL | 0 refills | Status: AC
  Filled 2022-04-07: qty 2, 2d supply, fill #0

## 2022-04-07 MED ORDER — ACETAMINOPHEN 325 MG PO TABS
650.0000 mg | ORAL_TABLET | ORAL | 0 refills | Status: AC | PRN
  Filled 2022-04-07: qty 180, 15d supply, fill #0

## 2022-04-08 ENCOUNTER — Ambulatory Visit: Payer: Self-pay

## 2022-04-08 NOTE — Lactation Note (Signed)
This note was copied from a baby's chart. Lactation Consultation Note  Patient Name: Cindy Randall Cindy Randall Date: 04/08/2022 Reason for consult: Follow-up assessment;Mother's request;Primapara;1st time breastfeeding;Term;Infant weight loss;Breastfeeding assistance (2.53% WL) Age:23 days  LC called to the patient room by the SLP.  The birth parent was engorged and wanting assistance.  Cindy Randall set the birth parent up with a DEBP.  She was able to pump 66mL and was still pumping when the Cindy Randall left the room.  LC assisted the birth parent with latching the infant to both breasts in the football position.  The infant latched deeply with his tongue down, lips were flanged, sucking was rhythmic, and swallows were noted.  Cindy Randall spoke with the birth parent about engorgement, breast care, warning signs, infant I/O, pumping frequency, and milk storage.  The birth parent is aware to breastfeed for 10-15 min and supplement afterwards for 10-15 min.  All questions were answered.  Cindy Randall sent a stork pump form and they will follow up with lactation to see if the birth parent qualified.    Infant feeding Plan:  Breastfeed 8+ times in 24 hours according to feeding cues.  Supplement according to supplementation guidelines after breastfeeding.  Keep feedings 30 min max.  Watch infant output and call the pediatrician with questions or concerns.  Call outpatient Cindy Randall for assistance with breastfeeding.   Maternal Data    Feeding Mother's Current Feeding Choice: Breast Milk and Formula  LATCH Score Latch: Grasps breast easily, tongue down, lips flanged, rhythmical sucking.  Audible Swallowing: Spontaneous and intermittent  Type of Nipple: Everted at rest and after stimulation  Comfort (Breast/Nipple): Soft / non-tender  Hold (Positioning): Assistance needed to correctly position infant at breast and maintain latch.  LATCH Score: 9   Lactation Tools Discussed/Used Tools: Pump Breast pump type:  Double-Electric Breast Pump Pump Education: Setup, frequency, and cleaning;Milk Storage Reason for Pumping: Engorged Pumped volume: 90 mL (Still pumping when LC left the room)  Interventions Interventions: Assisted with latch;DEBP;Breast massage;Support pillows;Adjust position  Discharge    Consult Status Consult Status: Complete Date: 04/08/22 Follow-up type: Call as needed    Cindy Randall 04/08/2022, 3:39 PM

## 2022-04-08 NOTE — Lactation Note (Addendum)
This note was copied from a baby's chart. Lactation Consultation Note  Patient Name: Cindy Randall BHALP'F Date: 04/08/2022 Reason for consult: Follow-up assessment;Infant < 6lbs;1st time breastfeeding;Infant weight loss (-3% weight loss) Age:23 days Birth Parent initial feeding choice was formula, Birth Parent decided she would like to breastfeed infant, infant BF earlier today with SLP and [employer] services. Birth Parent was engorged and expressing 90 mls when pumping. Birth Parent was not eligible for [facility] [employer] Pump and [facility] referral was sent to [facility], Support Person informed [place] He will buy Birth Parent a DEBP after they are discharge. LC discussed with RN that since Feeding choice has changed today  that Birth Parent may need to work on latching infant at breast and not be discharge due to infant being less than 5 lbs, was earlier admitted to NICU and only had two feedings where infant latched today. Birth Parent current feeding plan: 1- Birth Parent plans to latch infant at breast for every feeding 8+ times within 24 hours, STS due infant weight loss and infant's  size,  LC suggested Birth Parent limit Chest Feedings to 15 minutes or less and then afterwards supplement infant at every feeding with 40 +mls per feeding.  Limit infant's total feeding Breast and Bottle  to 30 minutes or less. 2-LC advised support to purchase DEBP and Birth Parent continue to pump every 3 hours for 15 minutes on initial setting. 3- Engorgement and treatment was discussed with previous LC earlier this evening.    Maternal Data Does the patient have breastfeeding experience prior to this delivery?: No  Feeding Mother's Current Feeding Choice: Breast Milk and Formula Nipple Type: Dr. Myra Gianotti Preemie  LATCH Score Latch: Grasps breast easily, tongue down, lips flanged, rhythmical sucking.  Audible Swallowing: Spontaneous and intermittent  Type of Nipple: Everted at rest and after stimulation  Comfort  (Breast/Nipple): Soft / non-tender  Hold (Positioning): Assistance needed to correctly position infant at breast and maintain latch.  LATCH Score: 9   Lactation Tools Discussed/Used Tools: Pump Breast pump type: Double-Electric Breast Pump Pump Education: Setup, frequency, and cleaning;Milk Storage Reason for Pumping: Engorged Pumped volume: 90 mL (Still pumping when LC left the room)  Interventions Interventions: Assisted with latch;DEBP;Breast massage;Support pillows;Adjust position  Discharge    Consult Status Consult Status: Follow-up Date: 04/09/22 Follow-up type: In-patient    Eulis Canner 04/08/2022, 5:00 PM

## 2022-04-10 ENCOUNTER — Other Ambulatory Visit: Payer: Medicaid Other

## 2022-04-10 ENCOUNTER — Encounter: Payer: Medicaid Other | Admitting: Advanced Practice Midwife

## 2022-04-11 ENCOUNTER — Ambulatory Visit (INDEPENDENT_AMBULATORY_CARE_PROVIDER_SITE_OTHER): Payer: Medicaid Other | Admitting: *Deleted

## 2022-04-11 DIAGNOSIS — Z0130 Encounter for examination of blood pressure without abnormal findings: Secondary | ICD-10-CM

## 2022-04-11 NOTE — Progress Notes (Signed)
   NURSE VISIT- BLOOD PRESSURE CHECK  SUBJECTIVE:  Cindy Randall is a 21 y.o. G41P1001 female here for BP check. She is postpartum, delivery date 04/05/22     HYPERTENSION ROS:  Pregnant/postpartum:  Severe headaches that don't go away with tylenol/other medicines: No  Visual changes (seeing spots/double/blurred vision) No  Severe pain under right breast breast or in center of upper chest No  Severe nausea/vomiting No  Taking medicines as instructed yes    OBJECTIVE:  BP 130/86 (BP Location: Right Arm, Patient Position: Sitting, Cuff Size: Normal)   Pulse 84   Breastfeeding Yes   Appearance alert, well appearing, and in no distress.  ASSESSMENT: Postpartum  blood pressure check  PLAN: Discussed with Dr. Charlotta Newton   Recommendations: no changes needed   Follow-up: as scheduled   Annamarie Dawley  04/11/2022 12:26 PM

## 2022-05-15 ENCOUNTER — Ambulatory Visit: Payer: Medicaid Other | Admitting: Advanced Practice Midwife

## 2022-07-07 ENCOUNTER — Encounter: Payer: Self-pay | Admitting: Advanced Practice Midwife

## 2022-07-31 ENCOUNTER — Encounter: Payer: Self-pay | Admitting: Radiology

## 2022-09-05 ENCOUNTER — Encounter: Payer: Self-pay | Admitting: Advanced Practice Midwife
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
